# Patient Record
Sex: Female | Born: 1945 | Race: Black or African American | Hispanic: No | Marital: Single | Smoking: Former smoker
Health system: Southern US, Community
[De-identification: ages and names within clinical notes are randomized; demographics above are authoritative.]

## PROBLEM LIST (undated history)

## (undated) DIAGNOSIS — I10 Essential (primary) hypertension: Secondary | ICD-10-CM

## (undated) DIAGNOSIS — E119 Type 2 diabetes mellitus without complications: Secondary | ICD-10-CM

## (undated) DIAGNOSIS — H409 Unspecified glaucoma: Secondary | ICD-10-CM

## (undated) DIAGNOSIS — C9000 Multiple myeloma not having achieved remission: Secondary | ICD-10-CM

## (undated) DIAGNOSIS — K922 Gastrointestinal hemorrhage, unspecified: Secondary | ICD-10-CM

## (undated) DIAGNOSIS — K5792 Diverticulitis of intestine, part unspecified, without perforation or abscess without bleeding: Secondary | ICD-10-CM

## (undated) DIAGNOSIS — K5790 Diverticulosis of intestine, part unspecified, without perforation or abscess without bleeding: Secondary | ICD-10-CM

## (undated) HISTORY — PX: THYROIDECTOMY: SHX17

## (undated) HISTORY — PX: CHOLECYSTECTOMY: SHX55

## (undated) HISTORY — PX: ABDOMINAL HYSTERECTOMY: SHX81

## (undated) HISTORY — PX: COLON SURGERY: SHX602

---

## 2004-04-23 ENCOUNTER — Ambulatory Visit: Payer: Self-pay | Admitting: Family Medicine

## 2008-07-04 ENCOUNTER — Ambulatory Visit: Payer: Self-pay | Admitting: Family Medicine

## 2008-07-04 ENCOUNTER — Emergency Department: Payer: Self-pay | Admitting: Internal Medicine

## 2008-08-04 ENCOUNTER — Ambulatory Visit: Payer: Self-pay | Admitting: Family Medicine

## 2010-05-03 ENCOUNTER — Ambulatory Visit: Payer: Self-pay | Admitting: Family

## 2011-09-12 ENCOUNTER — Inpatient Hospital Stay: Payer: Self-pay | Admitting: Internal Medicine

## 2011-09-12 LAB — COMPREHENSIVE METABOLIC PANEL
Calcium, Total: 8.6 mg/dL (ref 8.5–10.1)
Chloride: 111 mmol/L — ABNORMAL HIGH (ref 98–107)
Creatinine: 1.06 mg/dL (ref 0.60–1.30)
EGFR (African American): >60
Potassium: 4 mmol/L (ref 3.5–5.1)
SGOT(AST): 15 U/L (ref 15–37)
Sodium: 144 mmol/L (ref 136–145)
Total Protein: 6.9 g/dL (ref 6.4–8.2)

## 2011-09-12 LAB — CBC
MCH: 24.9 pg — ABNORMAL LOW (ref 26.0–34.0)
MCHC: 32.1 g/dL (ref 32.0–36.0)
MCV: 78 fL — ABNORMAL LOW (ref 80–100)
Platelet: 232 10*3/uL (ref 150–440)
RBC: 4.54 10*6/uL (ref 3.80–5.20)

## 2011-09-12 LAB — HEMOGLOBIN
HGB: 10.7 g/dL — ABNORMAL LOW (ref 12.0–16.0)
HGB: 10.2 g/dL — ABNORMAL LOW (ref 12.0–16.0)

## 2011-09-13 LAB — CBC WITH DIFFERENTIAL/PLATELET
Basophil #: 0 10*3/uL (ref 0.0–0.1)
Basophil %: 0.5 %
Eosinophil #: 0.1 10*3/uL (ref 0.0–0.7)
Eosinophil %: 1.8 %
HGB: 9.4 g/dL — ABNORMAL LOW (ref 12.0–16.0)
MCH: 25.1 pg — ABNORMAL LOW (ref 26.0–34.0)
MCHC: 32.2 g/dL (ref 32.0–36.0)
MCV: 78 fL — ABNORMAL LOW (ref 80–100)
Monocyte #: 0.4 x10 3/mm (ref 0.2–0.9)
Platelet: 200 10*3/uL (ref 150–440)
WBC: 4.5 10*3/uL (ref 3.6–11.0)

## 2011-09-13 LAB — HEMOGLOBIN: HGB: 8.5 g/dL — ABNORMAL LOW (ref 12.0–16.0)

## 2011-09-14 LAB — CBC WITH DIFFERENTIAL/PLATELET
Basophil #: 0 10*3/uL (ref 0.0–0.1)
Basophil %: 0.5 %
Eosinophil #: 0.1 10*3/uL (ref 0.0–0.7)
Eosinophil %: 1.7 %
Lymphocyte #: 2.3 10*3/uL (ref 1.0–3.6)
Lymphocyte %: 47.7 %
MCH: 25 pg — ABNORMAL LOW (ref 26.0–34.0)
MCHC: 32 g/dL (ref 32.0–36.0)
MCV: 78 fL — ABNORMAL LOW (ref 80–100)
Monocyte #: 0.4 x10 3/mm (ref 0.2–0.9)
Neutrophil %: 40.6 %
Platelet: 185 10*3/uL (ref 150–440)
RBC: 3.2 10*6/uL — ABNORMAL LOW (ref 3.80–5.20)
RDW: 16.3 % — ABNORMAL HIGH (ref 11.5–14.5)
WBC: 4.7 10*3/uL (ref 3.6–11.0)

## 2011-09-15 LAB — CBC WITH DIFFERENTIAL/PLATELET
Eosinophil #: 0.1 10*3/uL (ref 0.0–0.7)
Eosinophil %: 2 %
HCT: 23.3 % — ABNORMAL LOW (ref 35.0–47.0)
Lymphocyte #: 2.6 10*3/uL (ref 1.0–3.6)
MCH: 24.5 pg — ABNORMAL LOW (ref 26.0–34.0)
MCV: 79 fL — ABNORMAL LOW (ref 80–100)
Monocyte #: 0.4 x10 3/mm (ref 0.2–0.9)
Neutrophil #: 1.8 10*3/uL (ref 1.4–6.5)
Neutrophil %: 36.2 %
RDW: 15.9 % — ABNORMAL HIGH (ref 11.5–14.5)

## 2011-09-15 LAB — HEMOGLOBIN: HGB: 9.1 g/dL — ABNORMAL LOW (ref 12.0–16.0)

## 2011-09-16 LAB — HEMOGLOBIN: HGB: 9.7 g/dL — ABNORMAL LOW (ref 12.0–16.0)

## 2011-09-17 LAB — HEMOGLOBIN: HGB: 8.2 g/dL — ABNORMAL LOW (ref 12.0–16.0)

## 2014-05-06 IMAGING — NM NUCLEAR MEDICINE GASTROINTESTINAL BLEEDING STUDY
1 series · 6 of 6 positions shown · non-contrast
Comparison: none

REASON FOR EXAM: rectal bleeding
COMMENTS:   LMP: Post Hysterectomy

[Series 1000: gi bleed · 4.80mm/px · 6 of 250 frames shown]
[frame 21/250]
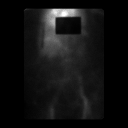
[frame 63/250]
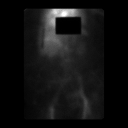
[frame 105/250]
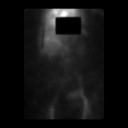
[frame 146/250]
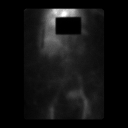
[frame 188/250]
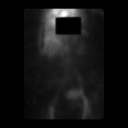
[frame 230/250]
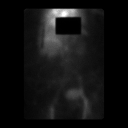

[6 of 6 positions shown; findings below may reference images not displayed]

PROCEDURE:     NM  - NM GI BLOOD LOSS STUDY  - September 12, 2011 [DATE]

RESULT:     Following intravenous administration of 3.0 mm PYP and
mCi technetium 99m Pertechnetate, serial views of the abdomen were obtained
up to 1 hour. No abnormal focal areas of increased tracer activity
indicative of GI bleed are seen. Tracer activity is visualized in the
urinary bladder.
IMPRESSION: No GI bleed is identified.

[REDACTED]

## 2014-05-06 IMAGING — CT CT ABD-PELV W/ CM
1 of 3 series · 12 of 32 positions shown, 18 images · non-contrast
Comparison: none

REASON FOR EXAM: (1) LUQ/LLQ pain, moderate blood in stool, h/o
diverticulitis with partial colec
COMMENTS:

[Series 2: 3mm soft tissue · axial · 0.79mm/px · z∈[-460,-91]mm · 12 of 147 slices shown, 18 images]
[im 12/147  soft-tissue]
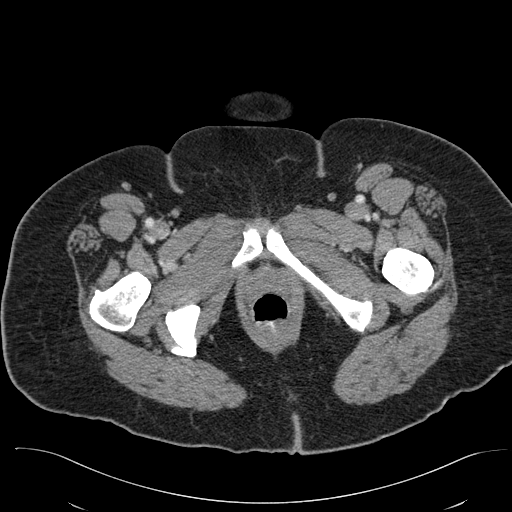
[im 12/147  bone]
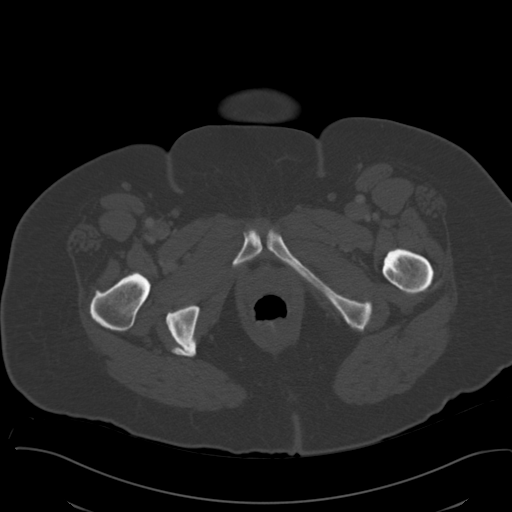
[im 23/147  soft-tissue]
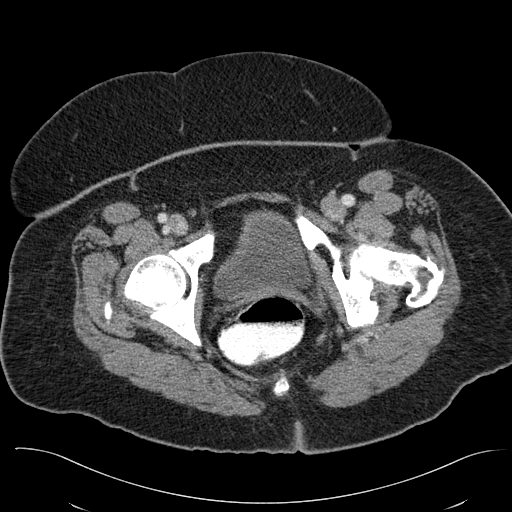
[im 34/147  soft-tissue]
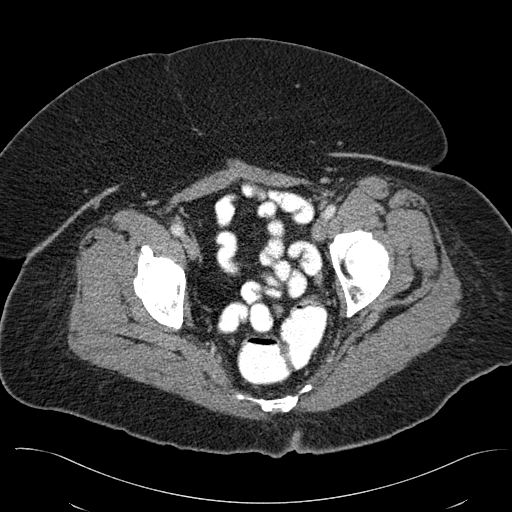
[im 45/147  soft-tissue]
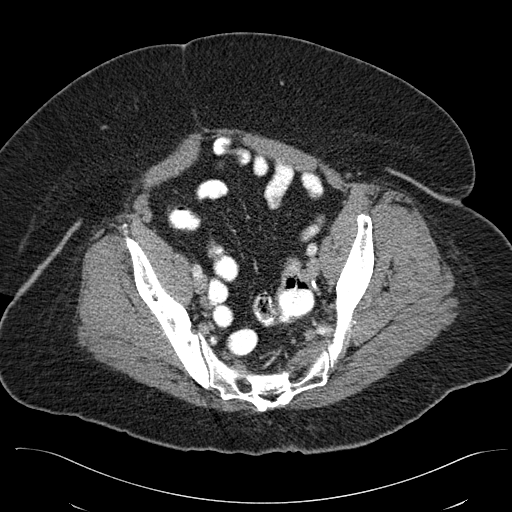
[im 57/147  soft-tissue]
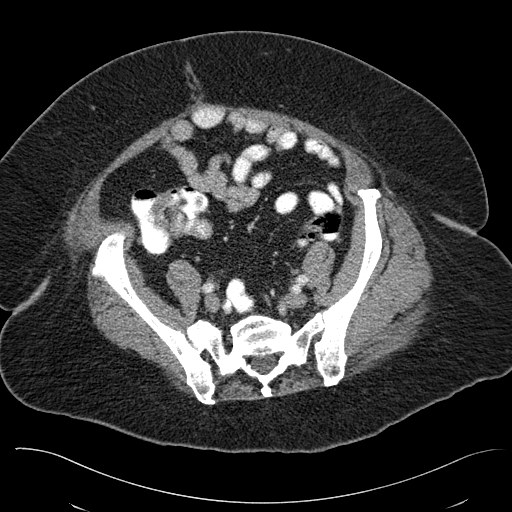
[im 68/147  soft-tissue]
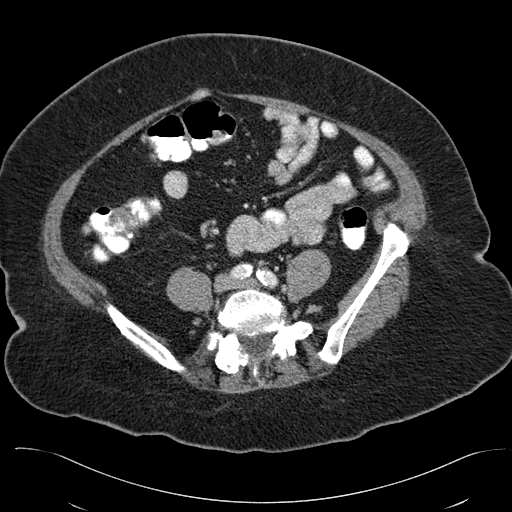
[im 79/147  soft-tissue]
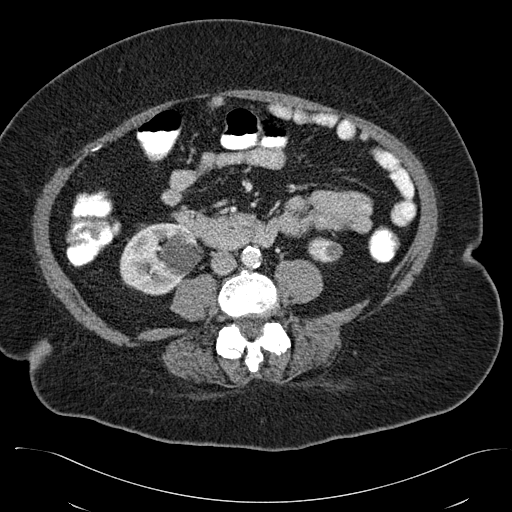
[im 90/147  soft-tissue]
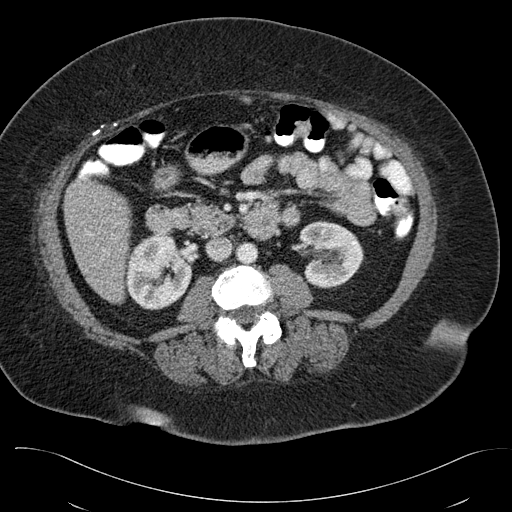
[im 102/147  soft-tissue]
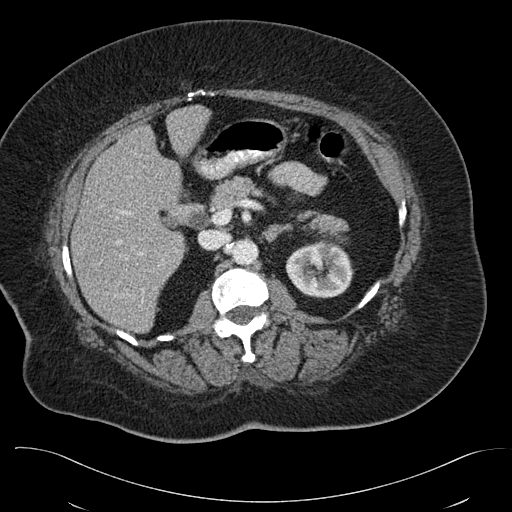
[im 102/147  lung]
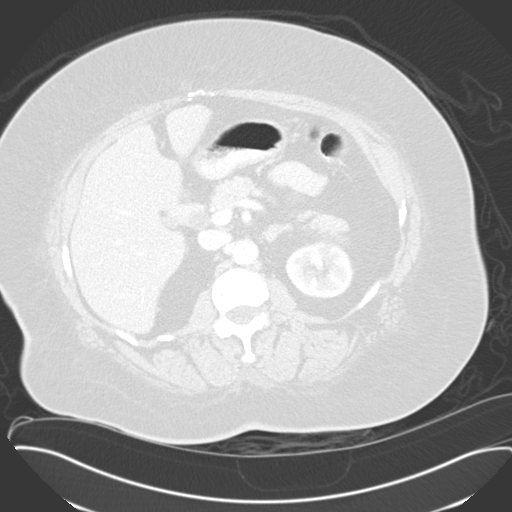
[im 102/147  bone]
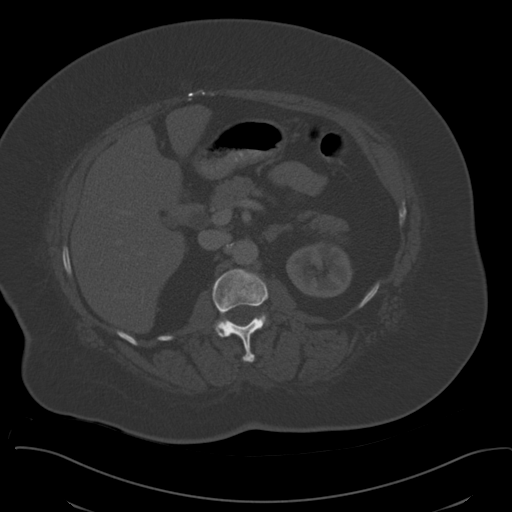
[im 113/147  soft-tissue]
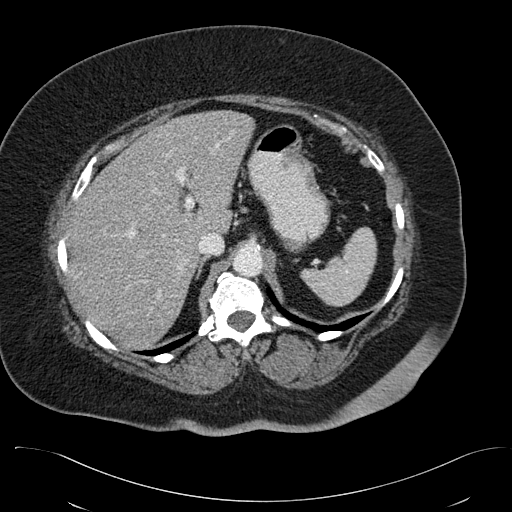
[im 113/147  lung]
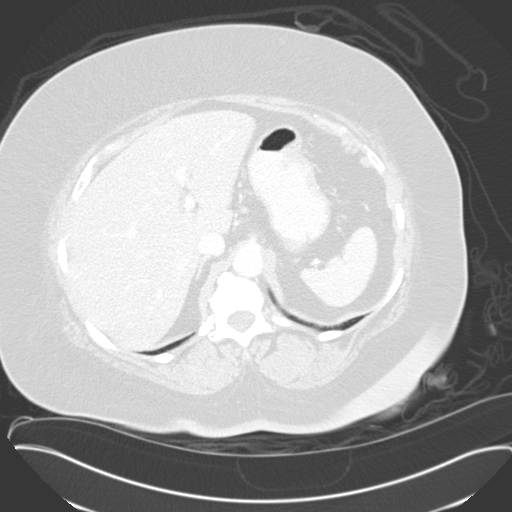
[im 124/147  soft-tissue]
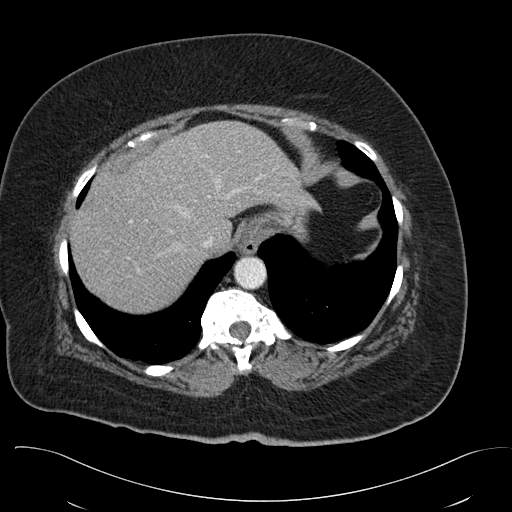
[im 124/147  lung]
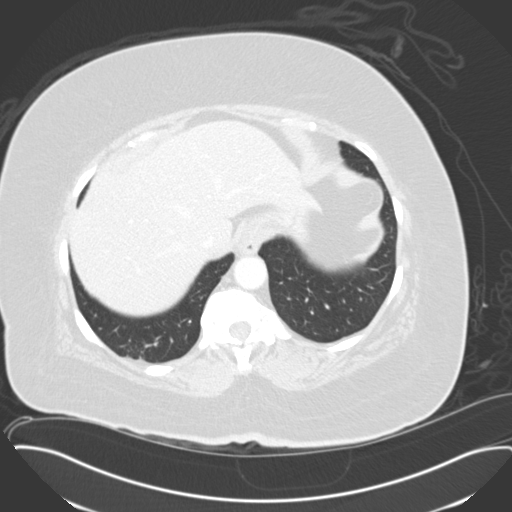
[im 135/147  soft-tissue]
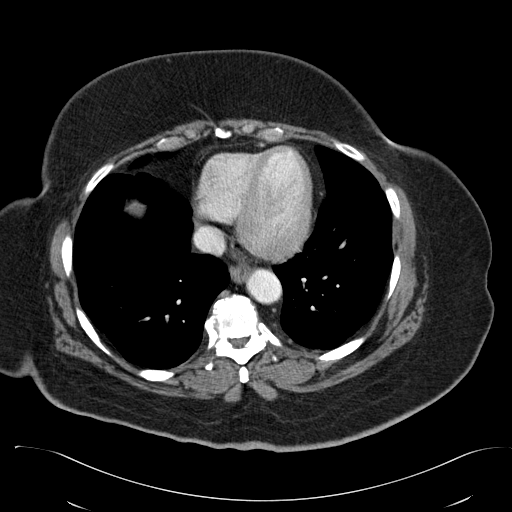
[im 135/147  lung]
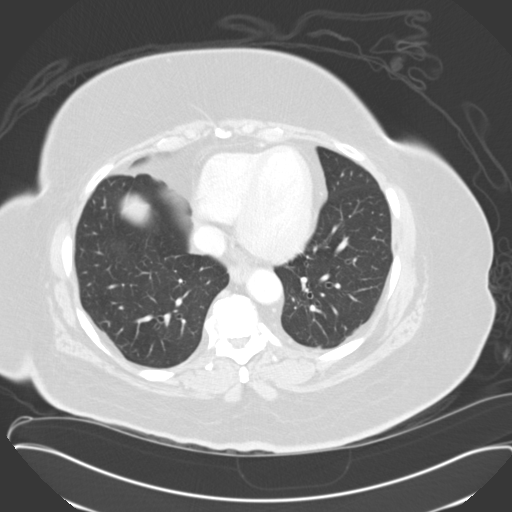

[12 of 32 positions shown; findings below may reference images not displayed]

PROCEDURE:     CT  - CT ABDOMEN / PELVIS  W  - September 12, 2011  [DATE]

RESULT:     Axial CT scanning was performed through the abdomen and pelvis
with reconstructions at 3 mm intervals and slice thicknesses. The patient
received 100 cc of 3sovue-100 and also received oral contrast material.
Review of multiplanar reconstructed images was performed separately on the
VIA monitor.

The orally administered contrast has traversed the small and large bowel.
There is a small umbilical hernia which contains short segment of small
bowel. This does not appear obstructed. There are scattered diverticula but
I do not see evidence of acute diverticulitis.

The liver exhibits decreased density suggesting fatty infiltration. The
gallbladder is surgically absent. The pancreas, partially distended stomach,
spleen, and adrenal glands are normal in appearance.

The kidneys exhibit hypodensities of varying sizes. There is a focus of
decreased density centered in the posterior cortex of the right kidney with
Hounsfield measurement of 52 on the immediate postcontrast images. On
delayed images it has Hounsfield measurement of 37. This measures proximally
1.2 cm in diameter and is not classic for a simple cyst. The kidneys exhibit
no evidence of obstruction nor of pyelonephritis.

The caliber of the abdominal aorta is normal. The urinary bladder is
partially distended and grossly normal. The uterus is surgically absent. I
see no adnexal masses. The lung bases exhibit minimal atelectasis or
fibrosis in the right posterior costophrenic gutter. The lumbar vertebral
bodies are preserved in height.
IMPRESSION: 1. I do not see evidence of acute diverticulitis or other acute bowel
abnormality. A knuckle of small bowel entersa shallow ventral hernia but
does not appear obstructed.
2. The gallbladder is surgically absent. I see no acute hepatobiliary
abnormality.
3. There are multiple hypodensities within the kidneys most compatible with
cysts. One however in the midpole of the right kidney posteriorly has
hyperdense Hounsfield numbers and is not clearly a simple cyst. Given that
there are no previous studies, a triphasic CT scan through the kidneys would
be useful for accurate Hounsfield measurement.

A preliminary report was sent to the [HOSPITAL] the conclusion
of the study.

## 2014-08-20 NOTE — Discharge Summary (Signed)
PATIENT NAME:  Hannah Graves, Hannah Graves MR#:  [id number] DATE OF BIRTH:  July 23, 1945  DATE OF ADMISSION:  09/12/2011 DATE OF DISCHARGE:  09/17/2011  PRIMARY CARE PHYSICIAN: Deirdre Evener, MD  FINAL DIAGNOSES:  1. Acute hemorrhagic anemia.  2. Lower gastrointestinal bleed, suspected diverticular.  3. Hypertension.  4. Diabetes.  5. Glaucoma.   DISCHARGE MEDICATIONS: 1. Metformin 500 mg extended-release daily.  2. Atenolol 100 mg daily.  3. Enalapril 20 mg daily.  4. Nitroglycerin 0.4 mg sublingually as needed.  5. Glipizide 10 mg extended-release daily.  6. Diltiazem 180 mg daily.  7. Latanoprost 0.005% ophthalmic solution to each eye at bedtime.   ADDITIONAL MEDICATION: Ferrous sulfate 325 mg twice a day.   NOTE: Do not take aspirin until seen by Dr. Jill Side as an outpatient.   DIET: Low sodium diet.   ACTIVITY: As tolerated.   DISCHARGE FOLLOWUP: Followup with Dr. Jill Side in two weeks and Dr. Deirdre Evener in one week.   REASON FOR ADMISSION: The patient was admitted 09/12/2011 and discharged 09/17/2011. She came in with rectal bleeding, large amount of rectal blood, five episodes at home and another two episodes in the emergency room. She was admitted with recurrent rectal bleeding, total of seven episodes.   LABS/STUDIES: Glucose 162, BUN 16, creatinine 1.06, sodium 144, potassium 4.0, chloride 111, CO2 25, and calcium 8.6. Liver function tests normal range. White blood cell count 6.1, hemoglobin and hematocrit 11.3 and 35.2, and platelet count 232.   EKG shows normal sinus rhythm, T wave abnormality inferolaterally.  CT scan of the abdomen and pelvis showed no evidence of acute diverticulitis, gallbladder surgically absent, and multiple hypodensities in the kidneys most compatible with cysts. One mid pole of the right kidney has a hyperdense Hounsfield unit that is not clearly a simple cyst. Can do a triphasic CT scan for further determination.   A GI blood loss  study was negative.   Hemoglobin dipped down to 10.7, then 10.2, then 9.4, then 8.5, 8.0, and then 7.3. The patient was transfused a unit of packed red blood cells, went up to 8.7 and then drifted down to 8.2 upon discharge.   HOSPITAL COURSE PER PROBLEM LIST:  1. For the patient's acute hemorrhagic anemia, hemoglobin kept drifting down. The patient did bleed, then stopped, then bled, and then bled again. A colonoscopy was done by Dr. Dionne Milo on 09/16/2011 that showed internal hemorrhoids and diverticulosis in the ascending colon. No active bleeding. The patient had no further bleeding from the point of the colonoscopy prep onwards. Hemoglobin is on the lower side at 8.2. The patient was transfused 1 unit of packed red blood cells on the hemoglobin of 7.3 and given iron supplementation upon discharge. Her aspirin was held.  2. For the lower gastrointestinal bleed, it is suspected to be diverticular bleed. No further bleeding upon discharge. The patient was discharged home in stable condition.  3. For her hypertension, blood pressure was stable throughout the hospital stay, 137/71 upon discharge.  4. For her diabetes, she was restarted on her glipizide and metformin once the diet was started back up again. Blood sugar upon discharge 122. 5. For glaucoma she is on latanoprost.     6. For her abnormal appearance of the kidney on initial CAT scan, can end up getting a CT scan with triphasic contrast as an outpatient. I know the patient does follow-up with a doctor for her multiple myeloma and was supposed to get a MRI of the back. Follow  up as an outpatient needed on this even if a sonogram is ordered.    TIME SPENT ON DISCHARGE: 35 minutes. ____________________________ Tana Conch. Leslye Peer, MD rjw:slb D: 09/17/2011 16:38:13 ET T: 09/18/2011 13:50:29 ET JOB#: [id number]  cc: Tana Conch. Leslye Peer, MD, <Dictator> Beat D. Albertine Patricia, MD Marisue Brooklyn MD ELECTRONICALLY SIGNED 09/28/2011 13:57

## 2014-08-20 NOTE — Consult Note (Signed)
Chief Complaint:   Subjective/Chief Complaint Feels well. No bleeding since yesterday morning.   VITAL SIGNS/ANCILLARY NOTES: **Vital Signs.:   19-May-13 08:10   Vital Signs Type Routine   Temperature Temperature (F) 97   Celsius 36.1   Temperature Source tympanic   Pulse Pulse 87   Pulse source per Dinamap   Respirations Respirations 16   Systolic BP Systolic BP 008   Diastolic BP (mmHg) Diastolic BP (mmHg) 69   Mean BP 85   Pulse Ox % Pulse Ox % 94   Pulse Ox Activity Level  At rest   Oxygen Delivery Room Air/ 21 %   Routine Hem:  19-May-13 06:36    WBC (CBC) 4.7   RBC (CBC) 3.20   Hemoglobin (CBC) 8.0   Hematocrit (CBC) 25.0   Platelet Count (CBC) 185   MCV 78   MCH 25.0   MCHC 32.0   RDW 16.3   Neutrophil % 40.6   Lymphocyte % 47.7   Monocyte % 9.5   Eosinophil % 1.7   Basophil % 0.5   Neutrophil # 1.9   Lymphocyte # 2.3   Monocyte # 0.4   Eosinophil # 0.1   Basophil # 0.0   Assessment/Plan:  Assessment/Plan:   Assessment Suspected diverticular bleeding, resolved. Blood loss anemia.    Plan Agree with full liquid diet. Repeat H and H in am. Will transfuse if further worsening in H and H or signs of active bleeding. If no further bleeding and H and H is stable by tomorrow, may go home with OP follow up with me in 2 weeks.   Electronic Signatures: Jill Side (MD)  (Signed [phone] 10:06)  Authored: Chief Complaint, VITAL SIGNS/ANCILLARY NOTES, Lab Results, Assessment/Plan   Last Updated: 19-May-13 10:06 by Jill Side (MD)

## 2014-08-20 NOTE — Consult Note (Signed)
Chief Complaint:   Subjective/Chief Complaint Patient had another episode of bleeding with further drop in H and H. Hemoglobin is better after transfusion. Will proceed with a colonoscopy tomorrow. Further recommendations to follow.   Electronic Signatures: Jill Side (MD)  (Signed 20-May-13 17:59)  Authored: Chief Complaint   Last Updated: 20-May-13 17:59 by Jill Side (MD)

## 2014-08-20 NOTE — Consult Note (Signed)
Chief Complaint:   Subjective/Chief Complaint Colonoscopy showed only few small right sided diverticuli wihout stigmata of recent bleeding. Colon is otherwise normal except medium internal hemorrhoids. No signs of bleeding more proximally as stools in colon were light green.  Imopression: Most likely diverticular bleeding, resolved. Colonoscopy withou any high risk lesions.  Recommendations: Full liquid diet. Possible DC in am if no further bleeding.   Electronic Signatures: Jill Side (MD)  (Signed 21-May-13 18:08)  Authored: Chief Complaint   Last Updated: 21-May-13 18:08 by Jill Side (MD)

## 2014-08-20 NOTE — Consult Note (Signed)
Chief Complaint:   Subjective/Chief Complaint Had further bleeding this morning, painless. Hemoglobin dropped to 8.5. Feels well overall.   VITAL SIGNS/ANCILLARY NOTES: **Vital Signs.:   18-May-13 16:10   Vital Signs Type Q 4hr   Temperature Temperature (F) 98   Celsius 36.6   Temperature Source oral   Pulse Pulse 75   Pulse source per Dinamap   Respirations Respirations 16   Systolic BP Systolic BP 354   Diastolic BP (mmHg) Diastolic BP (mmHg) 80   Mean BP 94   BP Source Dinamap   Pulse Ox % Pulse Ox % 97   Pulse Ox Activity Level  At rest   Oxygen Delivery Room Air/ 21 %   Routine Hem:  18-May-13 06:13    WBC (CBC) 4.5   RBC (CBC) 3.74   Hemoglobin (CBC) 9.4   Hematocrit (CBC) 29.2   Platelet Count (CBC) 200   MCV 78   MCH 25.1   MCHC 32.2   RDW 16.4   Neutrophil % 35.3   Lymphocyte % 53.5   Monocyte % 8.9   Eosinophil % 1.8   Basophil % 0.5   Neutrophil # 1.6   Lymphocyte # 2.4   Monocyte # 0.4   Eosinophil # 0.1   Basophil # 0.0  Blood Glucose:  18-May-13 07:36    POCT Blood Glucose 128    11:46    POCT Blood Glucose 171    13:38    POCT Blood Glucose 93  Routine Hem:  18-May-13 15:23    Hemoglobin (CBC) 8.5  Blood Glucose:  18-May-13 16:12    POCT Blood Glucose 109   Assessment/Plan:  Assessment/Plan:   Assessment Lower GI bleed. Diverticular bleed remains the most likely diagnosis. Hemodynamically stable.    Plan Follow H and H and transfuse if hemoglobin drops further. Clear liquid diet for now. Will follow.   Electronic Signatures: Jill Side (MD)  (Signed [phone] 17:25)  Authored: Chief Complaint, VITAL SIGNS/ANCILLARY NOTES, Lab Results, Assessment/Plan   Last Updated: 18-May-13 17:25 by Jill Side (MD)

## 2014-08-20 NOTE — H&P (Signed)
PATIENT NAME:  Hannah Graves, Hannah Graves MR#:  [id number] DATE OF BIRTH:  07/07/1945  DATE OF ADMISSION:  09/12/2011  PRIMARY CARE PHYSICIAN: Dr. Deirdre Evener.   CHIEF COMPLAINT: Rectal bleeding.   HISTORY OF PRESENT ILLNESS: Hannah Graves is a 69 year old African American female who was in her usual state of health until the last 24 hours when she started to have bright red blood per rectum. The patient reports a large amount of red blood, about five episodes at home. She had another two episodes while she is here in the Emergency Department. There is associated occasional mild cramping in the abdomen and a little nausea, but no vomiting and no fever. There is no prior history of rectal bleeding. She gave history of prior colon resection, stating it was for infection and a pocket of pus.   REVIEW OF SYSTEMS: CONSTITUTIONAL: Denies any fever. No chills. No fatigue. EYES: No blurring of vision. No double vision. ENT: No hearing impairment. No sore throat. No dysphagia. CARDIOVASCULAR: No chest pain. No shortness of breath. No edema. No syncope. RESPIRATORY: No cough. No sputum production. No shortness of breath. No chest pain. GASTROINTESTINAL: She has a little crampy abdominal pain occasionally in the last few hours and bright red blood per rectum. She had several episodes. No vomiting. GENITOURINARY: No dysuria. No frequency of urination. MUSCULOSKELETAL: No joint pain or swelling. No muscular pain or swelling. INTEGUMENTARY: No skin rash. No ulcers. NEUROLOGIC: No focal weakness. No seizure activity. No headache. PSYCHIATRY: No anxiety. No depression. ENDOCRINE: No polyuria or polydipsia. No heat or cold intolerance.   PAST MEDICAL HISTORY:  1. History of diabetes mellitus, type II. 2. Systemic hypertension. 3. Glaucoma.  4. History of multiple myeloma, in remission since 2004 after having stem cell transplant.   PAST SURGICAL HISTORY:  1. Colon resection for infection. 2. Partial thyroidectomy for  goiter. 3. Cholecystectomy. 4. Hysterectomy.   SOCIAL HABITS: Nonsmoker. No history of alcohol abuse.   SOCIAL HISTORY: She is divorced and has two girls. She is retired working with [employer].   FAMILY HISTORY: Both parents are deceased. They suffered from hypertension, diabetes, and both had heart attacks. She had a brother who died from pancreatic cancer.   ADMISSION MEDICATIONS:  1. Nitroglycerin 0.4 mg sublingual p.r.n.  2. Metformin 500 mg a day. 3. Glipizide 10 mg a day. 4. Enalapril 20 mg once a day. 5. Diltiazem 180 mg once a day. 6. Atenolol 100 mg once a day.  7. Aspirin 81 mg a day. 8. Eye drops using latanoprost 0.005% one drop in each eye once a day.   ALLERGIES: Sulfa causes skin rash.   PHYSICAL EXAMINATION:  VITAL SIGNS: Blood pressure 140/64, respiratory rate 18, pulse 60, temperature 98.4, oxygen saturation 100%. She is on oxygen.   GENERAL APPEARANCE: Elderly lady lying in bed in no acute distress.   HEAD AND NECK EXAMINATION: No pallor. No icterus. No cyanosis.   ENT: Hearing was normal. Nasal mucosa, lips, tongue were normal. She has upper dentures. Eye examination revealed normal eyelids and conjunctivae. Pupils about 5 mm, equal and reactive to light.   NECK: Supple. Trachea at midline. No cervical masses or lymphadenopathy.   HEART: Normal S1, S2. No S3, S4. No murmur. No gallop. No carotid bruits.   RESPIRATORY: Normal breathing pattern without use of accessory muscles. No rales. No wheezing.   ABDOMEN: Soft, distended without tenderness. No hepatosplenomegaly. No masses. No hernias.   SKIN: No ulcers. No subcutaneous nodules.   MUSCULOSKELETAL:  No joint swelling. No clubbing. She has a dressing over the left foot from recent surgery for bunion.   NEUROLOGIC: Cranial nerves II through XII are intact. No focal motor deficit.   PSYCHIATRIC: The patient is alert and oriented x3. Mood and affect were flat.   LABORATORY, DIAGNOSTIC, AND  RADIOLOGICAL DATA: Serum glucose 162, BUN 16, creatinine 1.06, sodium 144, potassium 4. Her liver function tests were normal, except for low albumin at 3.3. CBC showed white count 6,000, hemoglobin 11.3, hematocrit 35, platelet count 232. MCV was low at 78, MCH 24, MCHC 32. CT scan of the abdomen revealed previous cholecystectomy and hysterectomy. Very small umbilical hernia with very small portion of  small bowel slightly bulging into the umbilical hernia. No evidence of bowel obstruction. Colonic diverticulosis without evidence of diverticulitis. There is a small posterior lateral right renal intermediate density lesion, which is not definitely a simple cyst.   ASSESSMENT:  1. Recurrent rectal bleeding. She had a total of seven episodes of bright red blood per rectum in the last 24 hours.  2. Incidental finding of right kidney lesion, etiology is unclear.  3. Microcytic hypochromic anemia  4. Systemic hypertension.  5. Diabetes mellitus type 2.  6. History of multiple myeloma in remission since 2004.  7. Glaucoma. 8. History of colon resection.  9. History of partial thyroidectomy for goiter.  10. History of cholecystectomy.  11. Hysterectomy.   PLAN:  1. The patient was admitted to the telemetry for further evaluation.  2. Follow-up on hemoglobin q.8 hours.  3. I ordered nuclear bleeding scan.  4. Gastroenterology consultation.  5. I will continue her home medications as listed above, but I will hold aspirin.  6. I will hold metformin since she had a CT scan, although it is not clear for me if she received IV contrast or not.  7. I will place the patient on Protonix 40 mg p.o. daily hold although her bleeding appears to be lower gastrointestinal in origin. However, occasionally it could be upper gastrointestinal with rapid transit.  8. I spoke to the patient regarding LIVING WILL. She is unsure if she has one, but she indicates that she gave the power of attorney to her oldest daughter.    TIME SPENT EVALUATING THIS PATIENT: More than 50 minutes.   ____________________________ Clovis Pu. Lenore Manner, MD amd:ap D: 09/12/2011 06:30:43 ET T: 09/12/2011 07:14:26 ET JOB#: [id number]  cc: Clovis Pu. Lenore Manner, MD, <Dictator> Beat D. Albertine Patricia, MD Clovis Pu [place] MD ELECTRONICALLY SIGNED 09/12/2011 22:33

## 2014-08-20 NOTE — Consult Note (Signed)
PATIENT NAME:  Hannah Graves, Hannah Graves MR#:  [id number] DATE OF BIRTH:  Aug 25, 1945  DATE OF CONSULTATION:  09/12/2011  REFERRING PHYSICIAN:  Wilfred Curtis, MD   CONSULTING PHYSICIAN:  Jill Side, MD  REASON FOR CONSULTATION: Hematochezia.   HISTORY OF PRESENT ILLNESS: The patient is a 69 year old female who presented to the Emergency Room earlier this morning with several episodes of bright red blood per rectum at home starting at 4:00 p.m. yesterday afternoon. According to the patient, the first episode was at around 4:00 p.m. yesterday afternoon. After that, she had at least 4 or 5 episodes of dark red blood per rectum with clots. She had no significant abdominal pain. She came to the Emergency Room and had two more episodes in the ER last night. This morning she had one episode of bright red blood per rectum which was lighter in color, according to her. Her last bowel movement was a couple of hours ago, and she saw no blood in there. The patient denies any such symptoms in the past. She denies any nausea, vomiting, abdominal pain, or any other significant GI symptoms at this point.   PAST MEDICAL HISTORY: Significant for: 1. History of type 2 diabetes.  2. Systemic hypertension.  3. Glaucoma.  4. History of multiple myeloma, in remission.   PAST SURGICAL HISTORY:  1. History of colon resection for what appears to be acute diverticulitis.  2. History of thyroidectomy.  3. Cholecystectomy.  4. Hysterectomy.   ALLERGIES: Sulfa.    HOME MEDICATIONS: Metformin, glipizide, enalapril, diltiazem, atenolol,  aspirin, eyedrops, and nitroglycerin.   SOCIAL HISTORY: She is divorced. She does not smoke or drink.   FAMILY HISTORY: Unremarkable.   REVIEW OF SYSTEMS: Review of systems is grossly negative except for what is mentioned in the history of present illness.   PHYSICAL EXAMINATION:  GENERAL: Obese female, does not appear to be in any acute distress. Fully awake, alert, and oriented.    VITAL SIGNS: Heart rate is in the 60s and 70s. Blood pressure is 130/78.   HEENT: Examination is unremarkable.   NECK: Veins are flat.   LUNGS: Grossly clear to auscultation.   CARDIOVASCULAR: Regular rate and rhythm. No gallops or murmur.   ABDOMEN: Slightly distended but soft. Bowel sounds are positive. Nontender, no rebound or guarding was noted. No hepatosplenomegaly.   NEUROLOGICAL: Examination appears to be unremarkable.   LABORATORY, DIAGNOSTIC AND RADIOLOGICAL DATA:  Hemoglobin on admission was 11.3. With hydration it has slowly dropped down to 10.2. White cell count is 6.1.  Electrolytes are fine as well as BUN and creatinine. CT scan of the abdomen and pelvis showed multiple hyperdensities within the kidneys, most compatible cysts. No acute changes were noted. The gallbladder is surgically absent.   ASSESSMENT AND PLAN: Patient with hematochezia most likely secondary to diverticulosis. The patient had surgery several years ago for what appears to be a diverticular abscess which makes me think that the patient does have diverticulosis. The patient had a colonoscopy in 2009 in [place]. That report is not available. The patient's last bowel movement was without blood, and it appears that the bleeding has subsided. Her hemoglobin and hematocrit remain stable after a minimal drop due to hydration. I would advance her diet from clear liquid to full liquid. We will continue to observe her for another 24 to 48 hours. Further recommendations will be made in case we see signs of active recurrent gastrointestinal bleed. If there is no further bleeding within the next 24  to 48 hours, then no urgent intervention will be required, and the patient can follow up with a gastroenterologist as outpatient for further recommendations. A nuclear bleeding scan was negative as well. The plan has been discussed with the patient as well as Dr. Verdell Carmine. We will advance from clear liquid to full liquid,  repeat hemoglobin in the morning. Further recommendations to follow depending on the patient's hospital course.   ____________________________ Jill Side, MD si:cbb D: 09/12/2011 16:02:53 ET T: 09/12/2011 17:45:16 ET JOB#: [id number]  cc: Jill Side, MD, <Dictator> Jill Side MD ELECTRONICALLY SIGNED 09/17/2011 9:04

## 2015-03-20 ENCOUNTER — Encounter: Payer: Self-pay | Admitting: Emergency Medicine

## 2015-03-20 ENCOUNTER — Inpatient Hospital Stay: Payer: Medicare HMO

## 2015-03-20 ENCOUNTER — Inpatient Hospital Stay
Admission: EM | Admit: 2015-03-20 | Discharge: 2015-03-22 | DRG: 378 | Disposition: A | Discharge status: Home / Self Care | Payer: Medicare HMO | Attending: Internal Medicine | Admitting: Internal Medicine

## 2015-03-20 DIAGNOSIS — K625 Hemorrhage of anus and rectum: Secondary | ICD-10-CM | POA: Diagnosis present

## 2015-03-20 DIAGNOSIS — Z794 Long term (current) use of insulin: Secondary | ICD-10-CM

## 2015-03-20 DIAGNOSIS — I10 Essential (primary) hypertension: Secondary | ICD-10-CM | POA: Diagnosis present

## 2015-03-20 DIAGNOSIS — Z87891 Personal history of nicotine dependence: Secondary | ICD-10-CM | POA: Diagnosis not present

## 2015-03-20 DIAGNOSIS — Z7982 Long term (current) use of aspirin: Secondary | ICD-10-CM

## 2015-03-20 DIAGNOSIS — E860 Dehydration: Secondary | ICD-10-CM | POA: Diagnosis present

## 2015-03-20 DIAGNOSIS — H409 Unspecified glaucoma: Secondary | ICD-10-CM | POA: Diagnosis present

## 2015-03-20 DIAGNOSIS — K5791 Diverticulosis of intestine, part unspecified, without perforation or abscess with bleeding: Principal | ICD-10-CM | POA: Diagnosis present

## 2015-03-20 DIAGNOSIS — D62 Acute posthemorrhagic anemia: Secondary | ICD-10-CM | POA: Diagnosis present

## 2015-03-20 DIAGNOSIS — C9000 Multiple myeloma not having achieved remission: Secondary | ICD-10-CM | POA: Diagnosis present

## 2015-03-20 DIAGNOSIS — E119 Type 2 diabetes mellitus without complications: Secondary | ICD-10-CM | POA: Diagnosis present

## 2015-03-20 DIAGNOSIS — Z885 Allergy status to narcotic agent status: Secondary | ICD-10-CM

## 2015-03-20 DIAGNOSIS — E875 Hyperkalemia: Secondary | ICD-10-CM | POA: Diagnosis present

## 2015-03-20 DIAGNOSIS — I959 Hypotension, unspecified: Secondary | ICD-10-CM | POA: Diagnosis present

## 2015-03-20 DIAGNOSIS — Z79899 Other long term (current) drug therapy: Secondary | ICD-10-CM | POA: Diagnosis not present

## 2015-03-20 DIAGNOSIS — Z882 Allergy status to sulfonamides status: Secondary | ICD-10-CM | POA: Diagnosis not present

## 2015-03-20 DIAGNOSIS — K922 Gastrointestinal hemorrhage, unspecified: Secondary | ICD-10-CM | POA: Diagnosis present

## 2015-03-20 HISTORY — DX: Diverticulosis of intestine, part unspecified, without perforation or abscess without bleeding: K57.90

## 2015-03-20 HISTORY — DX: Gastrointestinal hemorrhage, unspecified: K92.2

## 2015-03-20 HISTORY — DX: Type 2 diabetes mellitus without complications: E11.9

## 2015-03-20 HISTORY — DX: Multiple myeloma not having achieved remission: C90.00

## 2015-03-20 HISTORY — DX: Diverticulitis of intestine, part unspecified, without perforation or abscess without bleeding: K57.92

## 2015-03-20 HISTORY — DX: Unspecified glaucoma: H40.9

## 2015-03-20 HISTORY — DX: Essential (primary) hypertension: I10

## 2015-03-20 LAB — URINALYSIS COMPLETE WITH MICROSCOPIC (ARMC ONLY)
BILIRUBIN URINE: NEGATIVE
Bacteria, UA: NONE SEEN
GLUCOSE, UA: NEGATIVE mg/dL
HGB URINE DIPSTICK: NEGATIVE
KETONES UR: NEGATIVE mg/dL
LEUKOCYTES UA: NEGATIVE
NITRITE: NEGATIVE
Protein, ur: NEGATIVE mg/dL
SPECIFIC GRAVITY, URINE: 1.014 (ref 1.005–1.030)
pH: 5 (ref 5.0–8.0)

## 2015-03-20 LAB — TYPE AND SCREEN
ABO/RH(D): O POS
Antibody Screen: NEGATIVE

## 2015-03-20 LAB — COMPREHENSIVE METABOLIC PANEL
ALBUMIN: 3.7 g/dL (ref 3.5–5.0)
ALK PHOS: 50 U/L (ref 38–126)
ALT: 21 U/L (ref 14–54)
ANION GAP: 6 (ref 5–15)
AST: 18 U/L (ref 15–41)
BILIRUBIN TOTAL: 0.5 mg/dL (ref 0.3–1.2)
BUN: 21 mg/dL — ABNORMAL HIGH (ref 6–20)
CALCIUM: 8.7 mg/dL — AB (ref 8.9–10.3)
CO2: 26 mmol/L (ref 22–32)
CREATININE: 1.24 mg/dL — AB (ref 0.44–1.00)
Chloride: 112 mmol/L — ABNORMAL HIGH (ref 101–111)
GFR calc non Af Amer: 44 mL/min — ABNORMAL LOW (ref >60)
GFR, EST AFRICAN AMERICAN: 51 mL/min — AB (ref >60)
GLUCOSE: 162 mg/dL — AB (ref 65–99)
Potassium: 5.2 mmol/L — ABNORMAL HIGH (ref 3.5–5.1)
Sodium: 144 mmol/L (ref 135–145)
TOTAL PROTEIN: 7.2 g/dL (ref 6.5–8.1)

## 2015-03-20 LAB — HEMOGLOBIN
Hemoglobin: 10.1 g/dL — ABNORMAL LOW (ref 12.0–16.0)
Hemoglobin: 9.9 g/dL — ABNORMAL LOW (ref 12.0–16.0)

## 2015-03-20 LAB — CBC
HEMATOCRIT: 36.5 % (ref 35.0–47.0)
Hemoglobin: 11.5 g/dL — ABNORMAL LOW (ref 12.0–16.0)
MCH: 24.4 pg — ABNORMAL LOW (ref 26.0–34.0)
MCHC: 31.5 g/dL — ABNORMAL LOW (ref 32.0–36.0)
MCV: 77.5 fL — AB (ref 80.0–100.0)
Platelets: 247 10*3/uL (ref 150–440)
RBC: 4.71 MIL/uL (ref 3.80–5.20)
RDW: 15.7 % — ABNORMAL HIGH (ref 11.5–14.5)
WBC: 4.8 10*3/uL (ref 3.6–11.0)

## 2015-03-20 LAB — GLUCOSE, CAPILLARY
GLUCOSE-CAPILLARY: 95 mg/dL (ref 65–99)
Glucose-Capillary: 84 mg/dL (ref 65–99)

## 2015-03-20 LAB — PROTIME-INR
INR: 1.04
Prothrombin Time: 13.8 seconds (ref 11.4–15.0)

## 2015-03-20 LAB — TROPONIN I

## 2015-03-20 LAB — ABO/RH: ABO/RH(D): O POS

## 2015-03-20 MED ORDER — ONDANSETRON HCL 4 MG/2ML IJ SOLN: 4.0000 mg | Freq: Four times a day (QID) | INTRAMUSCULAR | Status: DC | PRN

## 2015-03-20 MED ORDER — ALBUTEROL SULFATE (2.5 MG/3ML) 0.083% IN NEBU: 2.5000 mg | INHALATION_SOLUTION | RESPIRATORY_TRACT | Status: DC | PRN

## 2015-03-20 MED ORDER — TECHNETIUM TC 99M-LABELED RED BLOOD CELLS IV KIT
20.0000 | PACK | Freq: Once | INTRAVENOUS | Status: AC | PRN
  Administered 2015-03-20: 20.7 via INTRAVENOUS

## 2015-03-20 MED ORDER — INSULIN ASPART 100 UNIT/ML ~~LOC~~ SOLN: 0.0000 [IU] | Freq: Every day | SUBCUTANEOUS | Status: DC

## 2015-03-20 MED ORDER — SODIUM CHLORIDE 0.9 % IV SOLN
1000.0000 mL | Freq: Once | INTRAVENOUS | Status: AC
  Administered 2015-03-20: 1000 mL via INTRAVENOUS

## 2015-03-20 MED ORDER — SODIUM CHLORIDE 0.9 % IV SOLN
INTRAVENOUS | Status: AC
  Administered 2015-03-20 – 2015-03-21 (×2): via INTRAVENOUS

## 2015-03-20 MED ORDER — PANTOPRAZOLE SODIUM 40 MG IV SOLR
40.0000 mg | Freq: Two times a day (BID) | INTRAVENOUS | Status: DC
  Administered 2015-03-20 – 2015-03-22 (×5): 40 mg via INTRAVENOUS
  Filled 2015-03-20 (×5): qty 40

## 2015-03-20 MED ORDER — ONDANSETRON HCL 4 MG PO TABS: 4.0000 mg | ORAL_TABLET | Freq: Four times a day (QID) | ORAL | Status: DC | PRN

## 2015-03-20 MED ORDER — ACETAMINOPHEN 650 MG RE SUPP
650.0000 mg | Freq: Four times a day (QID) | RECTAL | Status: DC | PRN
Start: 2015-03-20 — End: 2015-03-22

## 2015-03-20 MED ORDER — FLUTICASONE PROPIONATE 50 MCG/ACT NA SUSP
2.0000 | Freq: Every day | NASAL | Status: DC
  Administered 2015-03-20 – 2015-03-22 (×3): 2 via NASAL
  Filled 2015-03-20: qty 16

## 2015-03-20 MED ORDER — ACETAMINOPHEN 325 MG PO TABS
650.0000 mg | ORAL_TABLET | Freq: Four times a day (QID) | ORAL | Status: DC | PRN
  Administered 2015-03-21: 650 mg via ORAL
  Filled 2015-03-20: qty 2

## 2015-03-20 MED ORDER — ONDANSETRON HCL 4 MG/2ML IJ SOLN
4.0000 mg | Freq: Once | INTRAMUSCULAR | Status: AC
  Administered 2015-03-20: 4 mg via INTRAVENOUS
  Filled 2015-03-20: qty 2

## 2015-03-20 MED ORDER — INSULIN ASPART 100 UNIT/ML ~~LOC~~ SOLN
0.0000 [IU] | Freq: Three times a day (TID) | SUBCUTANEOUS | Status: DC
Start: 2015-03-20 — End: 2015-03-22
  Administered 2015-03-21: 3 [IU] via SUBCUTANEOUS
  Administered 2015-03-22: 2 [IU] via SUBCUTANEOUS
  Filled 2015-03-20: qty 2
  Filled 2015-03-20: qty 3

## 2015-03-20 NOTE — ED Notes (Signed)
C/o dark red blood in the toilet after having BM this am, states she has hx of rectal bleeding and diverticulitis, states she has had 4 BMs this am already, denies any abd. Pain at present

## 2015-03-20 NOTE — ED Notes (Signed)
Pt more alert, b/p increasing, remians on monitor

## 2015-03-20 NOTE — ED Provider Notes (Addendum)
[facility] Emergency Department Provider Note     Time seen: ----------------------------------------- 10:04 AM on 03/20/2015 -----------------------------------------    I have reviewed the triage vital signs and the nursing notes.   HISTORY  Chief Complaint Rectal Bleeding    HPI Hannah Graves is a 69 y.o. female who presents ER after passing dark red blood in the toilet while having a bowel movement this morning. Patient states she does have a history of rectal bleeding and diverticulitis. States she's had 4 bowel movements this morning already, denies any abdominal pain currently. Patient states the first dose she had was very large, she feels like she lost much more than a cup of blood. Currently feels like she needs to have a bowel movement again.   Past Medical History  Diagnosis Date  . Diverticulitis   . Multiple myeloma (HCC)     There are no active problems to display for this patient.   Past Surgical History  Procedure Laterality Date  . Colon surgery      Allergies Oxycodone and Sulfa antibiotics  Social History Social History  Substance Use Topics  . Smoking status: Former Research scientist (life sciences)  . Smokeless tobacco: None  . Alcohol Use: No    Review of Systems Constitutional: Negative for fever. Eyes: Negative for visual changes. ENT: Negative for sore throat. Cardiovascular: Negative for chest pain. Respiratory: Negative for shortness of breath. Gastrointestinal: Negative for abdominal pain, positive for rectal bleeding Genitourinary: Negative for dysuria. Musculoskeletal: Negative for back pain. Skin: Negative for rash. Neurological: Negative for headaches, positive for weakness  10-point ROS otherwise negative.  ____________________________________________   PHYSICAL EXAM:  VITAL SIGNS: ED Triage Vitals  Enc Vitals Group     BP 03/20/15 0938 110/65 mmHg     Pulse Rate 03/20/15 0938 69     Resp 03/20/15 0938 18      Temp 03/20/15 0938 98.1 F (36.7 C)     Temp Source 03/20/15 0938 Oral     SpO2 03/20/15 0938 98 %     Weight 03/20/15 0938 232 lb (105.235 kg)     Height 03/20/15 0938 5' 6"  (1.676 m)     Head Cir --      Peak Flow --      Pain Score 03/20/15 0939 0     Pain Loc --      Pain Edu? --      Excl. in [place]? --     Constitutional: Alert and oriented. Well appearing and in no distress. Eyes: Conjunctivae are normal. PERRL. Normal extraocular movements. ENT   Head: Normocephalic and atraumatic.   Nose: No congestion/rhinnorhea.   Mouth/Throat: Mucous membranes are moist.   Neck: No stridor. Cardiovascular: Normal rate, regular rhythm. Normal and symmetric distal pulses are present in all extremities. No murmurs, rubs, or gallops. Respiratory: Normal respiratory effort without tachypnea nor retractions. Breath sounds are clear and equal bilaterally. No wheezes/rales/rhonchi. Gastrointestinal: Soft and nontender. No distention. No abdominal bruits.  Rectal: Grossly bloody, dark stool present Musculoskeletal: Nontender with normal range of motion in all extremities. No joint effusions.  No lower extremity tenderness nor edema. Neurologic:  Normal speech and language. No gross focal neurologic deficits are appreciated. Speech is normal. No gait instability. Skin:  Skin is warm, dry and intact. No rash noted. Psychiatric: Mood and affect are normal. Speech and behavior are normal. Patient exhibits appropriate insight and judgment. ____________________________________________  ED COURSE:  Pertinent labs & imaging results that were available during my care  of the patient were reviewed by me and considered in my medical decision making (see chart for details). Patient with bloody stools, possible diverticulosis. We will check basic labs and she will likely need admission.  EKG: Interpreted by me, normal sinus rhythm with a rate of 59 bpm, normal PR interval, normal QS with, normal QT  interval. There is evidence of LVH ____________________________________________    LABS (pertinent positives/negatives)  Labs Reviewed  COMPREHENSIVE METABOLIC PANEL - Abnormal; Notable for the following:    Potassium 5.2 (*)    Chloride 112 (*)    Glucose, Bld 162 (*)    BUN 21 (*)    Creatinine, Ser 1.24 (*)    Calcium 8.7 (*)    GFR calc non Af Amer 44 (*)    GFR calc Af Amer 51 (*)    All other components within normal limits  CBC - Abnormal; Notable for the following:    Hemoglobin 11.5 (*)    MCV 77.5 (*)    MCH 24.4 (*)    MCHC 31.5 (*)    RDW 15.7 (*)    All other components within normal limits  TROPONIN I  URINALYSIS COMPLETEWITH MICROSCOPIC ([facility] ONLY)  PROTIME-INR  TYPE AND SCREEN  ABO/RH   CRITICAL CARE Performed by: Earleen Newport   Total critical care time: 30 minutes  Critical care time was exclusive of separately billable procedures and treating other patients.  Critical care was necessary to treat or prevent imminent or life-threatening deterioration.  Critical care was time spent personally by me on the following activities: development of treatment plan with patient and/or surrogate as well as nursing, discussions with consultants, evaluation of patient's response to treatment, examination of patient, obtaining history from patient or surrogate, ordering and performing treatments and interventions, ordering and review of laboratory studies, ordering and review of radiographic studies, pulse oximetry and re-evaluation of patient's condition.  ____________________________________________  FINAL ASSESSMENT AND PLAN  Rectal bleeding, vasovagal syncope  Plan: Patient with labs and imaging as dictated above. Patient with transient hypotension likely secondary to vasovagal event. Patient had gotten acutely nauseated and dropped her blood pressure and heart rate. As resolved with lying or supine and getting a fluid bolus. Currently labs look grossly  unremarkable, she'll need serial lab work and GI consultation. She is still having bright red blood per rectum currently. Bleeding is likely secondary to diverticulosis.   Earleen Newport, MD   Earleen Newport, MD 03/20/15 [place], MD 03/20/15 1140

## 2015-03-20 NOTE — Consult Note (Signed)
GI Inpatient Consult Note  Reason for Consult: GI bleed   Attending Requesting Consult: Marchia Bond  History of Present Illness: Hannah Graves is a 69 y.o. female with a history of diverticulosis, hypertension, and diabetes admitted for rectal bleeding.  Per [facility] ED notes, patient presented for evaluation after passing dark red blood with 3 BMs this morning.  She endorsed a history of diverticulitosis and with rectal bleeding in 2013, receiving 2 units via transfusion at the time.  Significant labs included Hgb 11.5, MCV 77.5.  While in the ED, patient passed a large amount of blood with another BM and experienced a near-syncopal episode.  BP was found to be 53/24, which improved after starting IV fluids.  She was admitted for further evaluation and management.  Since admission, patients BP has remained stable between 94/56 and 127/58.  Hannah Graves reports mild SOB, dizziness, and generalized weakness, though improved from when she initially presented to the ED.  She has had two episodes of rectal bleeding since admission, but notes the volume of blood is smaller than the previous four episodes.  She continues to experience lower abdominal cramping prior to passing BRBPR, and also reports some moderate epigastric pain.  Patient notes she had been doing quite well until the rectal bleeding began yesterday.  She does not take any NSAIDs or blood thinners.  Denies dysphagia, reflux, diarrhea, abdominal pain, constipation, and hematochezia.  Also no family history of CCA, colon polyps, or other GI malignancy.  Of note, Hannah Graves underwent a colon resection from apparent diverticulitis with abscess formation per GI consult records from 2013.   Last colonoscopy: 09/16/11 [facility] - inpatient) - few small R-sided diverticuli w/o stigmata of bleeding, medium internal hemorrhoids Last endoscopy: none prior   Past Medical History:  Past Medical History  Diagnosis Date  . Diverticulitis   . Multiple  myeloma ([place])   . Hypertension   . Diabetes mellitus without complication ([place])   . Glaucoma   . Diverticulosis   . GI bleeding     Problem List: Patient Active Problem List   Diagnosis Date Noted  . GIB (gastrointestinal bleeding) 03/20/2015  . Hypotension 03/20/2015    Past Surgical History: Past Surgical History  Procedure Laterality Date  . Colon surgery    . Thyroidectomy    . Abdominal hysterectomy    . Cholecystectomy      Allergies: Allergies  Allergen Reactions  . Oxycodone Rash  . Sulfa Antibiotics Rash    Home Medications: Prescriptions prior to admission  Medication Sig Dispense Refill Last Dose  . aspirin EC 81 MG tablet Take 81 mg by mouth daily.   03/19/2015 at am  . atenolol (TENORMIN) 100 MG tablet Take 100 mg by mouth daily.   03/20/2015 at 0830  . Biotin 2500 MCG CAPS Take 2,500 mcg by mouth daily.   03/19/2015 at am  . cetirizine (ZYRTEC) 10 MG tablet Take 10 mg by mouth daily.   03/20/2015 at am  . fluticasone (FLONASE) 50 MCG/ACT nasal spray Place 2 sprays into both nostrils daily.   03/19/2015 at am  . furosemide (LASIX) 20 MG tablet Take 20 mg by mouth 2 (two) times daily.   03/20/2015 at am  . glipiZIDE (GLUCOTROL XL) 10 MG 24 hr tablet Take 10 mg by mouth 2 (two) times daily.   03/20/2015 at am  . losartan (COZAAR) 100 MG tablet Take 100 mg by mouth daily.   03/20/2015 at am  . vitamin C (ASCORBIC ACID) 500  MG tablet Take 500 mg by mouth daily.   03/19/2015 at am   Home medication reconciliation was completed with the patient.   Scheduled Inpatient Medications:   . fluticasone  2 spray Each Nare Daily  . insulin aspart  0-5 Units Subcutaneous QHS  . insulin aspart  0-9 Units Subcutaneous TID WC  . pantoprazole (PROTONIX) IV  40 mg Intravenous Q12H    Continuous Inpatient Infusions:   . sodium chloride      PRN Inpatient Medications:  acetaminophen **OR** acetaminophen, albuterol, ondansetron **OR** ondansetron (ZOFRAN) IV  Family  History: family history includes Diabetes in her father and mother; Heart attack in her father and mother; Hypertension in her father and mother.    Social History:   reports that she has quit smoking. She does not have any smokeless tobacco history on file. She reports that she does not drink alcohol.  Review of Systems: Constitutional: Weight is stable.  Eyes: No changes in vision. ENT: No oral lesions, sore throat.  GI: see HPI.  Heme/Lymph: No easy bruising.  CV: No chest pain.  GU: No hematuria.  Integumentary: No rashes.  Neuro: No headaches.  Psych: No depression/anxiety.  Endocrine: No heat/cold intolerance.  Allergic/Immunologic: No urticaria.  Resp: No cough, SOB.  Musculoskeletal: No joint swelling.    Physical Examination: BP 129/51 mmHg  Pulse 64  Temp(Src) 98 F (36.7 C) (Oral)  Resp 18  Ht 5\' 6"  (1.676 m)  Wt 105.235 kg (232 lb)  BMI 37.46 kg/m2  SpO2 100% Gen: NAD, alert and oriented x 4 HEENT: PEERLA, EOMI, Neck: supple, no JVD or thyromegaly Chest: CTA bilaterally, no wheezes, crackles, or other adventitious sounds CV: RRR, no m/g/c/r Abd: soft, mild epigastric > hypogastric and periumbilical TTP, ND, +BS in all four quadrants; no HSM, guarding, ridigity, or rebound tenderness Ext: no edema, well perfused with 2+ pulses, Skin: no rash or lesions noted Lymph: no LAD  Data: Lab Results  Component Value Date   WBC 4.8 03/20/2015   HGB 11.5* 03/20/2015   HCT 36.5 03/20/2015   MCV 77.5* 03/20/2015   PLT 247 03/20/2015    Recent Labs Lab 03/20/15 0943  HGB 11.5*   Lab Results  Component Value Date   NA 144 03/20/2015   K 5.2* 03/20/2015   CL 112* 03/20/2015   CO2 26 03/20/2015   BUN 21* 03/20/2015   CREATININE 1.24* 03/20/2015   Lab Results  Component Value Date   ALT 21 03/20/2015   AST 18 03/20/2015   ALKPHOS 50 03/20/2015   BILITOT 0.5 03/20/2015    Recent Labs Lab 03/20/15 0943  INR 1.04   Assessment/Plan: Hannah Graves is  a 69 y.o. female with a history of diverticulosis, hypertension, and diabetes admitted for rectal bleeding.  Thus far she has had 6 episodes of bleeding, 4 of which were fairly large (last resulting in significant hypotension and near syncope) and the last 2 of smaller volume per patient.  Hgb was 11.5 on admission, and vitals have remained stable with IVF after hypotensive episode in the ED.  Patient feels symptomatically improved, but was unsure if she was about to have another episode of rectal bleeding with a BM.  If this occurs, I recommend a tagged scan to identify the source of bleeding.  If there is no further rectal bleeding, I recommend OP follow-up to discuss colonoscopy.  Will continue to monitor labs and symptomatic progress.  Recommendations: - Monitor Hgb, transfuse if <7 - If Hgb and  hemodynamics stable, plan for outpatient follow-up to monitor Hgb and discuss colonoscopy - Recommend tagged scan if patient reports another episode of bleeding  Thank you for the consult. We will follow along with you. Please call with questions or concerns.  Lavera Guise, PA-C [facility] Phone: [phone] Pager: [phone]

## 2015-03-20 NOTE — ED Notes (Signed)
Pt had near syncope episode, o2 at 2 l/m, md at bedside, fluids started

## 2015-03-20 NOTE — ED Notes (Signed)
Pt up to bathroom, had large rectal bleeding,

## 2015-03-20 NOTE — H&P (Addendum)
[facility] [place] NAME: Hannah Graves    MR#:  [id number]  DATE OF BIRTH:  April 29, 1945  DATE OF ADMISSION:  03/20/2015  PRIMARY CARE PHYSICIAN: Artist Beach, MD   REQUESTING/REFERRING PHYSICIAN: Earleen Newport, MD  CHIEF COMPLAINT:   Chief Complaint  Patient presents with  . Rectal Bleeding   Rectal bleeding today. HISTORY OF PRESENT ILLNESS:  Hannah Graves  is a 69 y.o. female with a known history of GI bleeding with diverticulosis, hypertension and diabetes. The patient is started to have a rectal bleeding with the fresh blood noted this morning. She has total 3 episode of rectal bleeding at home and one episode in the ED hours ago. The patient also complains of the dizziness and generalized weakness with mild shortness of breath but she denies any chest pain or palpitation. She has nausea but no vomiting or diarrhea. The patient has hypotension 53/24 in ED. she had similar rectal bleeding due to diverticulosis in 2013 and got 2 units of blood transfusion.  PAST MEDICAL HISTORY:   Past Medical History  Diagnosis Date  . Diverticulitis   . Multiple myeloma ([place])   . Hypertension   . Diabetes mellitus without complication ([place])   . Glaucoma   . Diverticulosis   . GI bleeding     PAST SURGICAL HISTORY:   Past Surgical History  Procedure Laterality Date  . Colon surgery    . Thyroidectomy    . Abdominal hysterectomy    . Cholecystectomy      SOCIAL HISTORY:   Social History  Substance Use Topics  . Smoking status: Former Research scientist (life sciences)  . Smokeless tobacco: Not on file  . Alcohol Use: No    FAMILY HISTORY:   Family History  Problem Relation Age of Onset  . Heart attack Mother   . Hypertension Mother   . Diabetes Mother   . Heart attack Father   . Hypertension Father   . Diabetes Father     DRUG ALLERGIES:   Allergies  Allergen Reactions  . Oxycodone Rash  . Sulfa Antibiotics Rash    REVIEW OF  SYSTEMS:  CONSTITUTIONAL: No fever, has generalized weakness.  EYES: No blurred or double vision.  EARS, NOSE, AND THROAT: No tinnitus or ear pain.  RESPIRATORY: No cough, mild shortness of breath, no wheezing or hemoptysis.  CARDIOVASCULAR: No chest pain, orthopnea, edema.  GASTROINTESTINAL: Has nausea, no vomiting, diarrhea or abdominal pain. Has bloody stool. GENITOURINARY: No dysuria, hematuria.  ENDOCRINE: No polyuria, nocturia,  HEMATOLOGY: No anemia, easy bruising but has rectal bleeding SKIN: No rash or lesion. MUSCULOSKELETAL: No joint pain or arthritis.   NEUROLOGIC: No tingling, numbness, weakness.  PSYCHIATRY: No anxiety or depression.   MEDICATIONS AT HOME:   Prior to Admission medications   Medication Sig Start Date End Date Taking? Authorizing Provider  aspirin EC 81 MG tablet Take 81 mg by mouth daily.   Yes Historical Provider, MD  atenolol (TENORMIN) 100 MG tablet Take 100 mg by mouth daily.   Yes Historical Provider, MD  Biotin 2500 MCG CAPS Take 2,500 mcg by mouth daily.   Yes Historical Provider, MD  cetirizine (ZYRTEC) 10 MG tablet Take 10 mg by mouth daily.   Yes Historical Provider, MD  fluticasone (FLONASE) 50 MCG/ACT nasal spray Place 2 sprays into both nostrils daily.   Yes Historical Provider, MD  furosemide (LASIX) 20 MG tablet Take 20 mg by mouth 2 (two) times daily.   Yes  Historical Provider, MD  glipiZIDE (GLUCOTROL XL) 10 MG 24 hr tablet Take 10 mg by mouth 2 (two) times daily.   Yes Historical Provider, MD  losartan (COZAAR) 100 MG tablet Take 100 mg by mouth daily.   Yes Historical Provider, MD  vitamin C (ASCORBIC ACID) 500 MG tablet Take 500 mg by mouth daily.   Yes Historical Provider, MD      VITAL SIGNS:  Blood pressure 96/57, pulse 62, temperature 98.1 F (36.7 C), temperature source Oral, resp. rate 15, height 5' 6"  (1.676 m), weight 105.235 kg (232 lb), SpO2 100 %.  PHYSICAL EXAMINATION:  GENERAL:  69 y.o.-year-old patient lying in the  bed with no acute distress.  EYES: Pupils equal, round, reactive to light and accommodation. No scleral icterus. Extraocular muscles intact.  HEENT: Head atraumatic, normocephalic. Oropharynx and nasopharynx clear.  NECK:  Supple, no jugular venous distention. No thyroid enlargement, no tenderness.  LUNGS: Normal breath sounds bilaterally, no wheezing, rales,rhonchi or crepitation. No use of accessory muscles of respiration.  CARDIOVASCULAR: S1, S2 normal. No murmurs, rubs, or gallops.  ABDOMEN: Soft, tenderness on LLQ, nondistended. Bowel sounds present. No organomegaly or mass.  EXTREMITIES: No pedal edema, cyanosis, or clubbing.  NEUROLOGIC: Cranial nerves II through XII are intact. Muscle strength 5/5 in all extremities. Sensation intact. Gait not checked.  PSYCHIATRIC: The patient is alert and oriented x 3.  SKIN: No obvious rash, lesion, or ulcer.   LABORATORY PANEL:   CBC  Recent Labs Lab 03/20/15 0943  WBC 4.8  HGB 11.5*  HCT 36.5  PLT 247   ------------------------------------------------------------------------------------------------------------------  Chemistries   Recent Labs Lab 03/20/15 0943  NA 144  K 5.2*  CL 112*  CO2 26  GLUCOSE 162*  BUN 21*  CREATININE 1.24*  CALCIUM 8.7*  AST 18  ALT 21  ALKPHOS 50  BILITOT 0.5   ------------------------------------------------------------------------------------------------------------------  Cardiac Enzymes  Recent Labs Lab 03/20/15 0943  TROPONINI <0.03   ------------------------------------------------------------------------------------------------------------------  RADIOLOGY:  No results found.  EKG:   Orders placed or performed during the hospital encounter of 03/20/15  . ED EKG  . ED EKG    IMPRESSION AND PLAN:   Lower GI bleeding, possible due to diverticulosis Hypotension Mild hyperkalemia Dehydration Diabetes History of hypertension  The patient will be admitted to medical  floor. Keep nothing by mouth except medication, IV fluid support, Protonix IV twice a day and get GI consult. Follow-up hemoglobin every 6 hours. Hold aspirin. For hypotension, hold hypertension medication including atenolol, Lasix and losartan, continue IV fluid support. Bolus when necessary. For dehydration, continue IV fluid support and follow-up BMP. For diabetes, hold glipizide and start a sliding scale. I discussed with the Dr. Rayann Heman, GI physician, he suggested monitor hemoglobin, if the patient has active bleeding, get a bleeding scan.  All the records are reviewed and case discussed with ED provider. Management plans discussed with the patient, family and they are in agreement.  CODE STATUS: Full code  TOTAL TIME TAKING CARE OF THIS PATIENT: 57 minutes.    Demetrios Loll M.D on 03/20/2015 at 12:12 PM  Between 7am to 6pm - Pager - [phone]  After 6pm go to [email or web address] - password [email or web address] [facility]  Office  [phone]  CC: Primary care physician; Artist Beach, MD

## 2015-03-21 LAB — BASIC METABOLIC PANEL
ANION GAP: 5 (ref 5–15)
BUN: 15 mg/dL (ref 6–20)
CALCIUM: 7.8 mg/dL — AB (ref 8.9–10.3)
CHLORIDE: 116 mmol/L — AB (ref 101–111)
CO2: 24 mmol/L (ref 22–32)
CREATININE: 0.9 mg/dL (ref 0.44–1.00)
GFR calc non Af Amer: >60 mL/min (ref >60)
GLUCOSE: 106 mg/dL — AB (ref 65–99)
Potassium: 4 mmol/L (ref 3.5–5.1)
Sodium: 145 mmol/L (ref 135–145)

## 2015-03-21 LAB — CBC
HCT: 30 % — ABNORMAL LOW (ref 35.0–47.0)
HEMOGLOBIN: 9.7 g/dL — AB (ref 12.0–16.0)
MCH: 25.4 pg — AB (ref 26.0–34.0)
MCHC: 32.5 g/dL (ref 32.0–36.0)
MCV: 78.2 fL — AB (ref 80.0–100.0)
Platelets: 202 10*3/uL (ref 150–440)
RBC: 3.84 MIL/uL (ref 3.80–5.20)
RDW: 15.4 % — ABNORMAL HIGH (ref 11.5–14.5)
WBC: 5.4 10*3/uL (ref 3.6–11.0)

## 2015-03-21 LAB — GLUCOSE, CAPILLARY
GLUCOSE-CAPILLARY: 80 mg/dL (ref 65–99)
GLUCOSE-CAPILLARY: 99 mg/dL (ref 65–99)
Glucose-Capillary: 91 mg/dL (ref 65–99)
Glucose-Capillary: 204 mg/dL — ABNORMAL HIGH (ref 65–99)

## 2015-03-21 NOTE — Progress Notes (Signed)
[facility] [place] NAME: Hannah Graves    MR#:  [id number]  DATE OF BIRTH:  1946-03-10  SUBJECTIVE:  CHIEF COMPLAINT:   Chief Complaint  Patient presents with  . Rectal Bleeding   patient is a 69 year old African female with history of diverticular bleed in the past who presents to the hospital with recurrent rectal bleed. She feels good today. Denies any abdominal pain or recent rectal bleeding. She states that on arrival to emergency room, she had questions significant amount of bleeding, however, that has stopped now. Hemoglobin level drifted down from 11.5 on November 22 to 9.7 23rd of November. Blood pressure remains normal.   Review of Systems  Constitutional: Negative for fever, chills and weight loss.  HENT: Negative for congestion.   Eyes: Negative for blurred vision and double vision.  Respiratory: Negative for cough, sputum production, shortness of breath and wheezing.   Cardiovascular: Negative for chest pain, palpitations, orthopnea, leg swelling and PND.  Gastrointestinal: Negative for nausea, vomiting, abdominal pain, diarrhea, constipation and blood in stool.  Genitourinary: Negative for dysuria, urgency, frequency and hematuria.  Musculoskeletal: Negative for falls.  Neurological: Negative for dizziness, tremors, focal weakness and headaches.  Endo/Heme/Allergies: Does not bruise/bleed easily.  Psychiatric/Behavioral: Negative for depression. The patient does not have insomnia.     VITAL SIGNS: Blood pressure 143/54, pulse 84, temperature 98.4 F (36.9 C), temperature source Oral, resp. rate 16, height 5\' 6"  (1.676 m), weight 105.235 kg (232 lb), SpO2 98 %.  PHYSICAL EXAMINATION:   GENERAL:  69 y.o.-year-old patient lying in the bed with no acute distress.  EYES: Pupils equal, round, reactive to light and accommodation. No scleral icterus. Extraocular muscles intact.  HEENT: Head atraumatic, normocephalic.  Oropharynx and nasopharynx clear.  NECK:  Supple, no jugular venous distention. No thyroid enlargement, no tenderness.  LUNGS: Normal breath sounds bilaterally, no wheezing, rales,rhonchi or crepitation. No use of accessory muscles of respiration.  CARDIOVASCULAR: S1, S2 normal. No murmurs, rubs, or gallops.  ABDOMEN: Soft, nontender, nondistended. Bowel sounds present. No organomegaly or mass.  EXTREMITIES: No pedal edema, cyanosis, or clubbing.  NEUROLOGIC: Cranial nerves II through XII are intact. Muscle strength 5/5 in all extremities. Sensation intact. Gait not checked.  PSYCHIATRIC: The patient is alert and oriented x 3.  SKIN: No obvious rash, lesion, or ulcer.   ORDERS/RESULTS REVIEWED:   CBC  Recent Labs Lab 03/20/15 0943 03/20/15 1521 03/20/15 1811 03/21/15 0426  WBC 4.8  --   --  5.4  HGB 11.5* 10.1* 9.9* 9.7*  HCT 36.5  --   --  30.0*  PLT 247  --   --  202  MCV 77.5*  --   --  78.2*  MCH 24.4*  --   --  25.4*  MCHC 31.5*  --   --  32.5  RDW 15.7*  --   --  15.4*   ------------------------------------------------------------------------------------------------------------------  Chemistries   Recent Labs Lab 03/20/15 0943 03/21/15 0426  NA 144 145  K 5.2* 4.0  CL 112* 116*  CO2 26 24  GLUCOSE 162* 106*  BUN 21* 15  CREATININE 1.24* 0.90  CALCIUM 8.7* 7.8*  AST 18  --   ALT 21  --   ALKPHOS 50  --   BILITOT 0.5  --    ------------------------------------------------------------------------------------------------------------------ estimated creatinine clearance is 73.4 mL/min (by C-G formula based on Cr of 0.9). ------------------------------------------------------------------------------------------------------------------ No results for input(s): TSH, T4TOTAL, T3FREE, THYROIDAB in the last  72 hours.  Invalid input(s): FREET3  Cardiac Enzymes  Recent Labs Lab 03/20/15 0943  TROPONINI <0.03    ------------------------------------------------------------------------------------------------------------------ Invalid input(s): POCBNP ---------------------------------------------------------------------------------------------------------------  RADIOLOGY: Nm Gi Blood Loss  03/20/2015  CLINICAL DATA:  Active GI bleed since 4 a.m. History of GI bleed in 2013. EXAM: NUCLEAR MEDICINE GASTROINTESTINAL BLEEDING SCAN TECHNIQUE: Sequential abdominal images were obtained following intravenous administration of Tc-49m labeled red blood cells. RADIOPHARMACEUTICALS:  20.7 mCi Tc-24m in-vitro labeled red cells. COMPARISON:  GI bleeding scan 09/12/2011. CT abdomen and pelvis 09/12/2011. FINDINGS: Images are obtained out to 2 hours following radiotracer administration. No abnormal radiotracer accumulation is demonstrated. Location or presence of GI bleeding cannot be confirmed. Normal blood pool and vascular activity. Activity demonstrated in the urinary bladder. IMPRESSION: No abnormal radiotracer activity identified. No GI bleed identified. Electronically Signed   By: Lucienne Capers M.D.   On: 03/20/2015 22:22    EKG:  Orders placed or performed during the hospital encounter of 03/20/15  . ED EKG  . ED EKG  . EKG 12-Lead  . EKG 12-Lead    ASSESSMENT AND PLAN:  Principal Problem:   GIB (gastrointestinal bleeding) Active Problems:   Hypotension 1. Recurrent diverticular bleed, initiate patient on clear liquid diet, nothing red, awaiting for GI consult, bleeding scan was negative, may repeat if bleeding recurs 2. Acute posthemorrhagic anemia, following hemoglobin level daily and transfuse as needed. Initiate iron supplementation upon discharge 3. Diabetes mellitus, blood glucose levels are ranging between 80-200, continue sliding scale insulin 4. Essential hypertension, continue outpatient medications. Patient's blood pressure readings are stable   Management plans discussed with the  patient, family and they are in agreement.   DRUG ALLERGIES:  Allergies  Allergen Reactions  . Oxycodone Rash  . Sulfa Antibiotics Rash    CODE STATUS:     Code Status Orders        Start     Ordered   03/20/15 1341  Full code   Continuous     03/20/15 1340      TOTAL TIME TAKING CARE OF THIS PATIENT: 35 minutes.    Theodoro Grist M.D on 03/21/2015 at 3:47 PM  Between 7am to 6pm - Pager - [phone]  After 6pm go to [email or web address] - password [email or web address] [facility]  Office  [phone]  CC: Primary care physician; Artist Beach, MD

## 2015-03-21 NOTE — Progress Notes (Signed)
GI Inpatient Follow-up Note  Patient Identification: Hannah Graves is a 69 y.o. female with LGIB.   Subjective:  Tagged scan negative.  No further bleeding overnight. Small amt old blood this am.  Feels better. No sob, c/p, n/v.   Scheduled Inpatient Medications:  . fluticasone  2 spray Each Nare Daily  . insulin aspart  0-5 Units Subcutaneous QHS  . insulin aspart  0-9 Units Subcutaneous TID WC  . pantoprazole (PROTONIX) IV  40 mg Intravenous Q12H    Continuous Inpatient Infusions:     PRN Inpatient Medications:  acetaminophen **OR** acetaminophen, albuterol, ondansetron **OR** ondansetron (ZOFRAN) IV    Physical Examination: BP 149/63 mmHg  Pulse 88  Temp(Src) 98 F (36.7 C) (Oral)  Resp 18  Ht 5\' 6"  (1.676 m)  Wt 105.235 kg (232 lb)  BMI 37.46 kg/m2  SpO2 98% Gen: NAD, alert and oriented x 4 HEENT: PEERLA, EOMI, Neck: supple, no JVD or thyromegaly Chest: CTA bilaterally, no wheezes, crackles, or other adventitious sounds CV: RRR, no m/g/c/r Abd: soft, NT, ND, +BS in all four quadrants; no HSM, guarding, ridigity, or rebound tenderness Ext: no edema, well perfused with 2+ pulses, Skin: no rash or lesions noted Lymph: no LAD  Data: Lab Results  Component Value Date   WBC 5.4 03/21/2015   HGB 9.7* 03/21/2015   HCT 30.0* 03/21/2015   MCV 78.2* 03/21/2015   PLT 202 03/21/2015    Recent Labs Lab 03/20/15 1521 03/20/15 1811 03/21/15 0426  HGB 10.1* 9.9* 9.7*   Lab Results  Component Value Date   NA 145 03/21/2015   K 4.0 03/21/2015   CL 116* 03/21/2015   CO2 24 03/21/2015   BUN 15 03/21/2015   CREATININE 0.90 03/21/2015   Lab Results  Component Value Date   ALT 21 03/20/2015   AST 18 03/20/2015   ALKPHOS 50 03/20/2015   BILITOT 0.5 03/20/2015    Recent Labs Lab 03/20/15 0943  INR 1.04   Assessment/Plan: Hannah Graves is a 69 y.o. female with LGIB.   Bleeding has nearly resolved, tagged scan negative, and hgb nearly stable. Likely  resolved diverticular bleed.   Recommendations: - f/u in GI clinic for discussion of colonoscopy.  - safe for d/c once Hgb stable.   Please call with questions or concerns.  Marysue Fait, Grace Blight, MD

## 2015-03-22 DIAGNOSIS — D62 Acute posthemorrhagic anemia: Secondary | ICD-10-CM

## 2015-03-22 DIAGNOSIS — E119 Type 2 diabetes mellitus without complications: Secondary | ICD-10-CM

## 2015-03-22 DIAGNOSIS — I10 Essential (primary) hypertension: Secondary | ICD-10-CM

## 2015-03-22 LAB — GLUCOSE, CAPILLARY
GLUCOSE-CAPILLARY: 141 mg/dL — AB (ref 65–99)
Glucose-Capillary: 117 mg/dL — ABNORMAL HIGH (ref 65–99)
Glucose-Capillary: 123 mg/dL — ABNORMAL HIGH (ref 65–99)
Glucose-Capillary: 154 mg/dL — ABNORMAL HIGH (ref 65–99)

## 2015-03-22 LAB — HEMOGLOBIN: HEMOGLOBIN: 9.3 g/dL — AB (ref 12.0–16.0)

## 2015-03-22 MED ORDER — LOSARTAN POTASSIUM 50 MG PO TABS
100.0000 mg | ORAL_TABLET | Freq: Every day | ORAL | Status: DC
Start: 2015-03-22 — End: 2015-03-22
  Administered 2015-03-22: 100 mg via ORAL
  Filled 2015-03-22: qty 2

## 2015-03-22 MED ORDER — ATENOLOL 50 MG PO TABS
100.0000 mg | ORAL_TABLET | Freq: Every day | ORAL | Status: DC
  Administered 2015-03-22: 100 mg via ORAL
  Filled 2015-03-22: qty 2

## 2015-03-22 MED ORDER — PANTOPRAZOLE SODIUM 40 MG PO TBEC: 40.0000 mg | DELAYED_RELEASE_TABLET | Freq: Two times a day (BID) | ORAL | Status: DC

## 2015-03-22 NOTE — Progress Notes (Signed)
Patient A/O, no noted distress. Denies pain. Tolerated all meds. Patient requested not to awaken during the night resulted she couldn't sleep the night before. BP WNL. Staff will continue to monitor and meet needs. Patient slept well throughout the night.

## 2015-03-22 NOTE — Progress Notes (Signed)
03/22/2015 5:39 PM  Alvis Lemmings to be D/C'd Home per MD order.  Discussed prescriptions and follow up appointments with the patient. Prescriptions given to patient, medication list explained in detail. Pt verbalized understanding.    Medication List    STOP taking these medications        aspirin EC 81 MG tablet      TAKE these medications        atenolol 100 MG tablet  Commonly known as:  TENORMIN  Take 100 mg by mouth daily.     Biotin 2500 MCG Caps  Take 2,500 mcg by mouth daily.     cetirizine 10 MG tablet  Commonly known as:  ZYRTEC  Take 10 mg by mouth daily.     fluticasone 50 MCG/ACT nasal spray  Commonly known as:  FLONASE  Place 2 sprays into both nostrils daily.     furosemide 20 MG tablet  Commonly known as:  LASIX  Take 20 mg by mouth 2 (two) times daily.     glipiZIDE 10 MG 24 hr tablet  Commonly known as:  GLUCOTROL XL  Take 10 mg by mouth 2 (two) times daily.     losartan 100 MG tablet  Commonly known as:  COZAAR  Take 100 mg by mouth daily.     pantoprazole 40 MG tablet  Commonly known as:  PROTONIX  Take 1 tablet (40 mg total) by mouth 2 (two) times daily. Switch for any other PPI at similar dose and frequency     vitamin C 500 MG tablet  Commonly known as:  ASCORBIC ACID  Take 500 mg by mouth daily.        Filed Vitals:   03/22/15 0531 03/22/15 1244  BP: 173/59 181/68  Pulse: 80 89  Temp: 97.8 F (36.6 C) 98.3 F (36.8 C)  Resp:  18    Skin clean, dry and intact without evidence of skin break down, no evidence of skin tears noted. IV catheter discontinued intact. Site without signs and symptoms of complications. Dressing and pressure applied. Pt denies pain at this time. No complaints noted.  An After Visit Summary was printed and given to the patient. Patient escorted via [place], and D/C home via private auto.  Dola Argyle

## 2015-03-23 NOTE — Discharge Summary (Signed)
[facility] [place] NAME: Hannah Graves    MR#:  [id number]  DATE OF BIRTH:  [birth year]  DATE OF ADMISSION:  03/20/2015 ADMITTING PHYSICIAN: Demetrios Loll, MD  DATE OF DISCHARGE: 03/22/2015  5:43 PM  PRIMARY CARE PHYSICIAN: Artist Beach, MD     ADMISSION DIAGNOSIS:  Rectal bleeding [K62.5]  DISCHARGE DIAGNOSIS:  Principal Problem:   GIB (gastrointestinal bleeding) Active Problems:   Hypotension   Acute posthemorrhagic anemia   Essential hypertension   Diabetes mellitus ([place])   SECONDARY DIAGNOSIS:   Past Medical History  Diagnosis Date  . Diverticulitis   . Multiple myeloma ([place])   . Hypertension   . Diabetes mellitus without complication ([place])   . Glaucoma   . Diverticulosis   . GI bleeding     .pro HOSPITAL COURSE:  The patient is a 69 year old African female with history of lower GI bleed, thought to be diverticular bleed in the past who presents to the hospital with recurrent rectal bleed. She denied abdominal pain/  Hemoglobin level drifted down from 11.5 on November 22 to 9.7 23rd of November. Blood pressure remained normal. Initially patient was kept NPO, but when bleeding stopped, diet was advanced and since patient had no recurrent bleeding, she was felt to be stable to be discharged home. Of note, GI bleeding scan was negative.patient was seen and followed by gastroenterologist and was recommended to follow up as outpatient. Discussion  By problem: 1.. Recurrent lower GI bleeding, suspected diverticular bleed, appreciate GI consult, follow up as outpatient was recommended,  bleeding scan was negative, bleeding did not recur with advancement of diet, patient is being discharged home 11.24.16 2. Acute posthemorrhagic anemia, follow hemoglobin level as outpatient, the was no indication to transfuse. Initiated iron supplementation upon discharge 3. Diabetes mellitus, blood glucose levels are ranging between 80-200,  continue sliding scale insulin, resume home medications. 4. Essential hypertension, continue outpatient medications. Patient's blood pressure was high while holding medications, resume  DISCHARGE CONDITIONS:   stable  CONSULTS OBTAINED:  Treatment Team:  Josefine Class, MD  DRUG ALLERGIES:   Allergies  Allergen Reactions  . Oxycodone Rash  . Sulfa Antibiotics Rash    DISCHARGE MEDICATIONS:   Discharge Medication List as of 03/22/2015  4:38 PM    START taking these medications   Details  pantoprazole (PROTONIX) 40 MG tablet Take 1 tablet (40 mg total) by mouth 2 (two) times daily. Switch for any other PPI at similar dose and frequency, Starting 03/22/2015, Until Discontinued, Print      CONTINUE these medications which have NOT CHANGED   Details  atenolol (TENORMIN) 100 MG tablet Take 100 mg by mouth daily., Until Discontinued, Historical Med    Biotin 2500 MCG CAPS Take 2,500 mcg by mouth daily., Until Discontinued, Historical Med    cetirizine (ZYRTEC) 10 MG tablet Take 10 mg by mouth daily., Until Discontinued, Historical Med    fluticasone (FLONASE) 50 MCG/ACT nasal spray Place 2 sprays into both nostrils daily., Until Discontinued, Historical Med    furosemide (LASIX) 20 MG tablet Take 20 mg by mouth 2 (two) times daily., Until Discontinued, Historical Med    glipiZIDE (GLUCOTROL XL) 10 MG 24 hr tablet Take 10 mg by mouth 2 (two) times daily., Until Discontinued, Historical Med    losartan (COZAAR) 100 MG tablet Take 100 mg by mouth daily., Until Discontinued, Historical Med    vitamin C (ASCORBIC ACID) 500 MG tablet Take 500 mg by mouth  daily., Until Discontinued, Historical Med      STOP taking these medications     aspirin EC 81 MG tablet          DISCHARGE INSTRUCTIONS:    Follow up with PCP, gastroenterology as outpatient  If you experience worsening of your admission symptoms, develop shortness of breath, life threatening emergency, suicidal  or homicidal thoughts you must seek medical attention immediately by calling 911 or calling your MD immediately  if symptoms less severe.  You Must read complete instructions/literature along with all the possible adverse reactions/side effects for all the Medicines you take and that have been prescribed to you. Take any new Medicines after you have completely understood and accept all the possible adverse reactions/side effects.   Please note  You were cared for by a hospitalist during your hospital stay. If you have any questions about your discharge medications or the care you received while you were in the hospital after you are discharged, you can call the unit and asked to speak with the hospitalist on call if the hospitalist that took care of you is not available. Once you are discharged, your primary care physician will handle any further medical issues. Please note that NO REFILLS for any discharge medications will be authorized once you are discharged, as it is imperative that you return to your primary care physician (or establish a relationship with a primary care physician if you do not have one) for your aftercare needs so that they can reassess your need for medications and monitor your lab values.    Today   CHIEF COMPLAINT:   Chief Complaint  Patient presents with  . Rectal Bleeding    HISTORY OF PRESENT ILLNESS:  Hannah Graves  is a 69 y.o. female female with a known history of lower GI bleed, thought to be diverticular bleed in the past who presents to the hospital with recurrent rectal bleed. She denied abdominal pain/  Hemoglobin level drifted down from 11.5 on November 22 to 9.7 23rd of November. Blood pressure remained normal. Initially patient was kept NPO, but when bleeding stopped, diet was advanced and since patient had no recurrent bleeding, she was felt to be stable to be discharged home. Of note, GI bleeding scan was negative.patient was seen and followed by  gastroenterologist and was recommended to follow up as outpatient. Discussion  By problem: 1.. Recurrent lower GI bleeding, suspected diverticular bleed, appreciate GI consult, follow up as outpatient was recommended,  bleeding scan was negative, bleeding did not recur with advancement of diet, patient is being discharged home 11.24.16 2. Acute posthemorrhagic anemia, follow hemoglobin level as outpatient, the was no indication to transfuse. Initiated iron supplementation upon discharge 3. Diabetes mellitus, blood glucose levels are ranging between 80-200, continue sliding scale insulin, resume home medications. 4. Essential hypertension, continue outpatient medications. Patient's blood pressure was high while holding medications, resume    VITAL SIGNS:  Blood pressure 181/68, pulse 89, temperature 98.3 F (36.8 C), temperature source Oral, resp. rate 18, height 5' 6"  (1.676 m), weight 105.235 kg (232 lb), SpO2 100 %.  I/O:  No intake or output data in the 24 hours ending 03/23/15 1706  PHYSICAL EXAMINATION:  GENERAL:  69 y.o.-year-old patient lying in the bed with no acute distress.  EYES: Pupils equal, round, reactive to light and accommodation. No scleral icterus. Extraocular muscles intact.  HEENT: Head atraumatic, normocephalic. Oropharynx and nasopharynx clear.  NECK:  Supple, no jugular venous distention. No thyroid enlargement, no tenderness.  LUNGS: Normal breath sounds bilaterally, no wheezing, rales,rhonchi or crepitation. No use of accessory muscles of respiration.  CARDIOVASCULAR: S1, S2 normal. No murmurs, rubs, or gallops.  ABDOMEN: Soft, non-tender, non-distended. Bowel sounds present. No organomegaly or mass.  EXTREMITIES: No pedal edema, cyanosis, or clubbing.  NEUROLOGIC: Cranial nerves II through XII are intact. Muscle strength 5/5 in all extremities. Sensation intact. Gait not checked.  PSYCHIATRIC: The patient is alert and oriented x 3.  SKIN: No obvious rash, lesion,  or ulcer.   DATA REVIEW:   CBC  Recent Labs Lab 03/21/15 0426 03/22/15 0859  WBC 5.4  --   HGB 9.7* 9.3*  HCT 30.0*  --   PLT 202  --     Chemistries   Recent Labs Lab 03/20/15 0943 03/21/15 0426  NA 144 145  K 5.2* 4.0  CL 112* 116*  CO2 26 24  GLUCOSE 162* 106*  BUN 21* 15  CREATININE 1.24* 0.90  CALCIUM 8.7* 7.8*  AST 18  --   ALT 21  --   ALKPHOS 50  --   BILITOT 0.5  --     Cardiac Enzymes  Recent Labs Lab 03/20/15 [place] <0.03    Microbiology Results  No results found for this or any previous visit.  RADIOLOGY:  No results found.  EKG:   Orders placed or performed during the hospital encounter of 03/20/15  . ED EKG  . ED EKG  . EKG 12-Lead  . EKG 12-Lead      Management plans discussed with the patient, family and they are in agreement.  CODE STATUS:   TOTAL TIME TAKING CARE OF THIS PATIENT: 40 minutes.    Theodoro Grist M.D on 03/23/2015 at 5:06 PM  Between 7am to 6pm - Pager - [phone]  After 6pm go to [email or web address] - password [email or web address] [facility]  Office  [phone]  CC: Primary care physician; Artist Beach, MD

## 2016-08-28 ENCOUNTER — Ambulatory Visit (INDEPENDENT_AMBULATORY_CARE_PROVIDER_SITE_OTHER): Payer: Medicare HMO

## 2016-08-28 ENCOUNTER — Encounter: Payer: Self-pay | Admitting: Podiatry

## 2016-08-28 ENCOUNTER — Ambulatory Visit (INDEPENDENT_AMBULATORY_CARE_PROVIDER_SITE_OTHER): Payer: Medicare HMO | Admitting: Podiatry

## 2016-08-28 VITALS — Resp 16 | Ht 65.0 in | Wt 226.0 lb

## 2016-08-28 DIAGNOSIS — M25571 Pain in right ankle and joints of right foot: Secondary | ICD-10-CM

## 2016-08-28 DIAGNOSIS — M722 Plantar fascial fibromatosis: Secondary | ICD-10-CM | POA: Diagnosis not present

## 2016-08-28 DIAGNOSIS — S92221D Displaced fracture of lateral cuneiform of right foot, subsequent encounter for fracture with routine healing: Secondary | ICD-10-CM

## 2016-08-28 DIAGNOSIS — M79671 Pain in right foot: Secondary | ICD-10-CM

## 2016-08-28 MED ORDER — TRIAMCINOLONE ACETONIDE 10 MG/ML IJ SUSP
10.0000 mg | Freq: Once | INTRAMUSCULAR | Status: AC
  Administered 2016-08-28: 10 mg

## 2016-08-29 NOTE — Progress Notes (Signed)
Subjective:    Patient ID: Hannah Graves, female   DOB: 71 y.o.   MRN: [id number]   HPI patient presents stating that she twisted her foot about a month ago and her ankle started to bother her and she is getting quite a bit of pain in her heelThat is making walking difficult    Review of Systems  All other systems reviewed and are negative.       Objective:  Physical Exam  Cardiovascular: Intact distal pulses.   Musculoskeletal: Normal range of motion.  Neurological: She is alert.  Skin: Skin is warm.  Nursing note and vitals reviewed.  neurovascular found to be intact muscle strengths within normal limits. Patient's found to have discomfort in the outside of the right ankle around the fibular malleolus and slightly distal. There is also quite a bit of discomfort in the plantar heel which is making her walk differently can treating to the pain in the outside of the foot area patient's found have good digital perfusion well oriented 3     Assessment:  Possibility for fracture of the right distal ankle versus spur formation and may been present and plantar fasciitis right       Plan:    H&P and x-ray reviewed with patient. At this point I'm an immobilizer and boot that she has at home and I did go ahead and injected the plantar fascia 3 Mill grams Kenalog 5 mill grams Xylocaine. Discussed the bone fragment I saw on the lateral view and will have to watch that and decide whether or not that is contributory to her problem depending how she responds to immobilization and reduction of plantar fascial pain  X-ray indicated there is what's possible to have free-floating bone chip on the lateral side of the right foot that may or may not be new secondary to injury or may be something that an artifact

## 2016-09-18 ENCOUNTER — Encounter: Payer: Self-pay | Admitting: Podiatry

## 2016-09-18 ENCOUNTER — Ambulatory Visit (INDEPENDENT_AMBULATORY_CARE_PROVIDER_SITE_OTHER): Payer: Medicare HMO | Admitting: Podiatry

## 2016-09-18 DIAGNOSIS — M722 Plantar fascial fibromatosis: Secondary | ICD-10-CM

## 2016-09-18 DIAGNOSIS — M779 Enthesopathy, unspecified: Secondary | ICD-10-CM

## 2016-09-18 MED ORDER — TRIAMCINOLONE ACETONIDE 10 MG/ML IJ SUSP
10.0000 mg | Freq: Once | INTRAMUSCULAR | Status: AC
  Administered 2016-09-18: 10 mg

## 2016-09-18 NOTE — Progress Notes (Signed)
Subjective:    Patient ID: Hannah Graves, female   DOB: 71 y.o.   MRN: [id number]   HPI patient states the heels feeling quite a bit better with some discomfort on the outside of the right ankle but still present but improved from previous visit    ROS      Objective:  Physical Exam Neurovascular status intact with improved right plantar fashion with mild discomfort posterior plantar aspect of the right ankle    Assessment:    Possibility that this is an inflammatory condition or it could be possible 2 small bone spur that's present. At this point I want to try to avoid surgery to a careful sheath injection right and explained if it does not get better this may require excision. She will wear her boot as she can't be seen back in 2 weeks     Plan:    Above-mentioned injection accomplished

## 2016-10-16 ENCOUNTER — Ambulatory Visit: Payer: Medicare HMO | Admitting: Podiatry

## 2023-10-15 ENCOUNTER — Encounter: Payer: Self-pay | Admitting: Ophthalmology

## 2023-10-20 NOTE — Discharge Instructions (Signed)

## 2023-10-21 ENCOUNTER — Other Ambulatory Visit: Payer: Self-pay

## 2023-10-21 ENCOUNTER — Ambulatory Visit
Admission: RE | Admit: 2023-10-21 | Discharge: 2023-10-21 | Disposition: A | Discharge status: Home / Self Care | Attending: Ophthalmology | Admitting: Ophthalmology

## 2023-10-21 ENCOUNTER — Ambulatory Visit: Payer: Self-pay | Admitting: Anesthesiology

## 2023-10-21 ENCOUNTER — Encounter: Payer: Self-pay | Admitting: Ophthalmology

## 2023-10-21 ENCOUNTER — Encounter
Admission: RE | Disposition: A | Discharge status: Home / Self Care | Payer: Self-pay | Source: Home / Self Care | Attending: Ophthalmology

## 2023-10-21 DIAGNOSIS — Z7984 Long term (current) use of oral hypoglycemic drugs: Secondary | ICD-10-CM | POA: Diagnosis not present

## 2023-10-21 DIAGNOSIS — E1136 Type 2 diabetes mellitus with diabetic cataract: Secondary | ICD-10-CM | POA: Diagnosis not present

## 2023-10-21 DIAGNOSIS — H25011 Cortical age-related cataract, right eye: Secondary | ICD-10-CM | POA: Diagnosis present

## 2023-10-21 DIAGNOSIS — H2511 Age-related nuclear cataract, right eye: Secondary | ICD-10-CM | POA: Diagnosis present

## 2023-10-21 HISTORY — PX: CATARACT EXTRACTION W/PHACO: SHX586

## 2023-10-21 LAB — GLUCOSE, CAPILLARY: Glucose-Capillary: 139 mg/dL — ABNORMAL HIGH (ref 70–99)

## 2023-10-21 SURGERY — PHACOEMULSIFICATION, CATARACT, WITH IOL INSERTION
Anesthesia: Monitor Anesthesia Care | Site: Eye | Laterality: Right

## 2023-10-21 MED ORDER — SIGHTPATH DOSE#1 NA HYALUR & NA CHOND-NA HYALUR IO KIT
PACK | INTRAOCULAR | Status: DC | PRN
  Administered 2023-10-21: 1 via OPHTHALMIC

## 2023-10-21 MED ORDER — ARMC OPHTHALMIC DILATING DROPS
OPHTHALMIC | Status: AC
  Filled 2023-10-21: qty 0.5

## 2023-10-21 MED ORDER — SIGHTPATH DOSE#1 BSS IO SOLN
INTRAOCULAR | Status: DC | PRN
  Administered 2023-10-21: 15 mL via INTRAOCULAR

## 2023-10-21 MED ORDER — CEFUROXIME OPHTHALMIC INJECTION 1 MG/0.1 ML
INJECTION | OPHTHALMIC | Status: DC | PRN
  Administered 2023-10-21: 1 mg via INTRACAMERAL

## 2023-10-21 MED ORDER — FENTANYL CITRATE (PF) 100 MCG/2ML IJ SOLN
INTRAMUSCULAR | Status: AC
  Filled 2023-10-21: qty 2

## 2023-10-21 MED ORDER — FENTANYL CITRATE (PF) 100 MCG/2ML IJ SOLN
INTRAMUSCULAR | Status: DC | PRN
  Administered 2023-10-21 (×2): 50 ug via INTRAVENOUS

## 2023-10-21 MED ORDER — LIDOCAINE HCL (PF) 2 % IJ SOLN
INTRAOCULAR | Status: DC | PRN
  Administered 2023-10-21: 2 mL

## 2023-10-21 MED ORDER — MIDAZOLAM HCL 2 MG/2ML IJ SOLN
INTRAMUSCULAR | Status: AC
  Filled 2023-10-21: qty 2

## 2023-10-21 MED ORDER — ARMC OPHTHALMIC DILATING DROPS
1.0000 | OPHTHALMIC | Status: DC | PRN
  Administered 2023-10-21 (×3): 1 via OPHTHALMIC

## 2023-10-21 MED ORDER — TETRACAINE HCL 0.5 % OP SOLN
OPHTHALMIC | Status: AC
Start: 2023-10-21 — End: 2023-10-21
  Filled 2023-10-21: qty 4

## 2023-10-21 MED ORDER — MIDAZOLAM HCL 2 MG/2ML IJ SOLN
INTRAMUSCULAR | Status: DC | PRN
  Administered 2023-10-21: 2 mg via INTRAVENOUS

## 2023-10-21 MED ORDER — TETRACAINE HCL 0.5 % OP SOLN
1.0000 [drp] | OPHTHALMIC | Status: DC | PRN
  Administered 2023-10-21 (×3): 1 [drp] via OPHTHALMIC

## 2023-10-21 MED ORDER — BRIMONIDINE TARTRATE 0.15 % OP SOLN
OPHTHALMIC | Status: DC | PRN
  Administered 2023-10-21: 1 [drp] via OPHTHALMIC

## 2023-10-21 MED ORDER — SIGHTPATH DOSE#1 BSS IO SOLN
INTRAOCULAR | Status: DC | PRN
  Administered 2023-10-21: 58 mL via OPHTHALMIC

## 2023-10-21 SURGICAL SUPPLY — 10 items
CATARACT SUITE SIGHTPATH (MISCELLANEOUS) ×1 IMPLANT
FEE CATARACT SUITE SIGHTPATH (MISCELLANEOUS) ×1 IMPLANT
GLOVE BIOGEL PI IND STRL 8 (GLOVE) ×1 IMPLANT
GLOVE SURG LX STRL 7.5 STRW (GLOVE) ×1 IMPLANT
GLOVE SURG PROTEXIS BL SZ6.5 (GLOVE) ×1 IMPLANT
GLOVE SURG SYN 6.5 PF PI BL (GLOVE) ×1 IMPLANT
LENS IOL TECNIS EYHANCE 21.0 (Intraocular Lens) IMPLANT
NDL FILTER BLUNT 18X1 1/2 (NEEDLE) ×1 IMPLANT
NEEDLE FILTER BLUNT 18X1 1/2 (NEEDLE) ×1 IMPLANT
SYR 3ML LL SCALE MARK (SYRINGE) ×1 IMPLANT

## 2023-10-21 NOTE — Transfer of Care (Signed)
 Immediate Anesthesia Transfer of Care Note  Patient: KAEDENCE CONNELLY  Procedure(s) Performed: PHACOEMULSIFICATION, CATARACT, WITH IOL INSERTION 6.18 00:37.8 (Right: Eye)  Patient Location: PACU  Anesthesia Type: MAC  Level of Consciousness: awake, alert  and patient cooperative  Airway and Oxygen Therapy: Patient Spontanous Breathing and Patient connected to supplemental oxygen  Post-op Assessment: Post-op Vital signs reviewed, Patient's Cardiovascular Status Stable, Respiratory Function Stable, Patent Airway and No signs of Nausea or vomiting  Post-op Vital Signs: Reviewed and stable  Complications: No notable events documented.

## 2023-10-21 NOTE — H&P (Signed)
 [facility]   Primary Care Physician:  Cleotilde Ned, MD Ophthalmologist: Dr. Dene Etienne  Pre-Procedure History & Physical: HPI:  Hannah Graves is a 78 y.o. female here for ophthalmic surgery.   Past Medical History:  Diagnosis Date   Diabetes mellitus without complication (HCC)    Diverticulitis    Diverticulosis    GI bleeding    Glaucoma    Hypertension    Multiple myeloma (HCC)     Past Surgical History:  Procedure Laterality Date   ABDOMINAL HYSTERECTOMY     CHOLECYSTECTOMY     COLON SURGERY     THYROIDECTOMY      Prior to Admission medications   Medication Sig Start Date End Date Taking? Authorizing Provider  acetaminophen  (TYLENOL ) 650 MG CR tablet Take 650 mg by mouth every 8 (eight) hours as needed for pain.   Yes [provider]  albuterol  (ACCUNEB ) 1.25 MG/3ML nebulizer solution Take 1 ampule by nebulization every 6 (six) hours as needed for wheezing.   Yes [provider]  Biotin 2500 MCG CAPS Take 2,500 mcg by mouth daily.   Yes [provider]  Cholecalciferol (VITAMIN D3) 1000 units CAPS Take 1 capsule by mouth daily.   Yes [provider]  DILT-XR 180 MG 24 hr capsule  06/09/16  Yes [provider]  fluticasone  (FLONASE ) 50 MCG/ACT nasal spray Place 2 sprays into both nostrils daily.   Yes [provider]  furosemide (LASIX) 40 MG tablet Take 40 mg by mouth 2 (two) times daily.   Yes [provider]  gabapentin (NEURONTIN) 300 MG capsule Take 300 mg by mouth 3 (three) times daily.   Yes [provider]  glipiZIDE (GLUCOTROL XL) 10 MG 24 hr tablet Take 10 mg by mouth 2 (two) times daily.   Yes [provider]  losartan  (COZAAR ) 100 MG tablet Take 100 mg by mouth daily.   Yes [provider]  montelukast (SINGULAIR) 10 MG tablet Take 10 mg by mouth at bedtime.   Yes [provider]  tiotropium (SPIRIVA) 18 MCG inhalation capsule Place 18 mcg  into inhaler and inhale daily.   Yes [provider]  vitamin C (ASCORBIC ACID) 500 MG tablet Take 500 mg by mouth daily.   Yes [provider]    Allergies as of 10/08/2023 - Review Complete 09/18/2016  Allergen Reaction Noted   Oxycodone Rash 03/20/2015   Sulfa antibiotics Rash 03/20/2015    Family History  Problem Relation Age of Onset   Heart attack Mother    Hypertension Mother    Diabetes Mother    Heart attack Father    Hypertension Father    Diabetes Father     Social History   Socioeconomic History   Marital status: Single    Spouse name: Not on file   Number of children: Not on file   Years of education: Not on file   Highest education level: Not on file  Occupational History   Not on file  Tobacco Use   Smoking status: Former   Smokeless tobacco: Not on file  Substance and Sexual Activity   Alcohol use: No   Drug use: Not on file   Sexual activity: Not on file  Other Topics Concern   Not on file  Social History Narrative   Not on file   Social Drivers of Health   Financial Resource Strain: Low Risk  (09/08/2022)   Received from [facility]   Overall  Financial Resource Strain (CARDIA)    Difficulty of Paying Living Expenses: Not hard at all  Food Insecurity: No Food Insecurity (09/08/2022)   Received from [facility]   Hunger Vital Sign    Within the past 12 months, you worried that your food would run out before you got the money to buy more.: Never true    Within the past 12 months, the food you bought just didn't last and you didn't have money to get more.: Never true  Transportation Needs: No Transportation Needs (09/08/2022)   Received from [facility]   PRAPARE - Transportation    Lack of Transportation (Medical): No    Lack of Transportation (Non-Medical): No  Physical Activity: Inactive (08/31/2020)   Received from [facility]   Exercise Vital Sign    On average, how many days per week do you engage in  moderate to strenuous exercise (like a brisk walk)?: 0 days    On average, how many minutes do you engage in exercise at this level?: 0 min  Stress: Not on file  Social Connections: Not on file  Intimate Partner Violence: Not At Risk (09/08/2022)   Received from [facility]   Humiliation, Afraid, Rape, and Kick questionnaire    Within the last year, have you been afraid of your partner or ex-partner?: No    Within the last year, have you been humiliated or emotionally abused in other ways by your partner or ex-partner?: No    Within the last year, have you been kicked, hit, slapped, or otherwise physically hurt by your partner or ex-partner?: No    Within the last year, have you been raped or forced to have any kind of sexual activity by your partner or ex-partner?: No    Review of Systems: See HPI, otherwise negative ROS  Physical Exam: BP (!) 158/69   Pulse 98   Temp (!) 97.3 F (36.3 C) (Temporal)   Resp 20   Ht 5' 6.5 (1.689 m)   Wt 104.2 kg   SpO2 98%   BMI 36.52 kg/m  General:   Alert,  pleasant and cooperative in NAD Head:  Normocephalic and atraumatic. Lungs:  Clear to auscultation.    Heart:  Regular rate and rhythm.   Impression/Plan: Hannah Graves is here for ophthalmic surgery.  Risks, benefits, limitations, and alternatives regarding ophthalmic surgery have been reviewed with the patient.  Questions have been answered.  All parties agreeable.   Hannah GASKIN, MD  10/21/2023, 7:31 AM

## 2023-10-21 NOTE — Anesthesia Postprocedure Evaluation (Signed)
 Anesthesia Post Note  Patient: Hannah Graves  Procedure(s) Performed: PHACOEMULSIFICATION, CATARACT, WITH IOL INSERTION 6.18 00:37.8 (Right: Eye)  Patient location during evaluation: PACU Anesthesia Type: MAC Level of consciousness: awake and alert Pain management: pain level controlled Vital Signs Assessment: post-procedure vital signs reviewed and stable Respiratory status: spontaneous breathing, nonlabored ventilation, respiratory function stable and patient connected to nasal cannula oxygen Cardiovascular status: stable and blood pressure returned to baseline Postop Assessment: no apparent nausea or vomiting Anesthetic complications: no   No notable events documented.   Last Vitals:  Vitals:   10/21/23 0813 10/21/23 0818  BP:  (!) 113/50  Pulse: 83 83  Resp: 17 17  Temp: (!) 36.3 C (!) 36.3 C  SpO2: 96% 96%    Last Pain:  Vitals:   10/21/23 0818  TempSrc:   PainSc: 0-No pain                 Lendia LITTIE Mae

## 2023-10-21 NOTE — Op Note (Signed)
 LOCATION:  [facility]   PREOPERATIVE DIAGNOSIS:    Nuclear sclerotic cataract right eye. H25.11   POSTOPERATIVE DIAGNOSIS:  Nuclear sclerotic cataract right eye.     PROCEDURE:  Phacoemusification with posterior chamber intraocular lens placement of the right eye   ULTRASOUND TIME: Procedure(s): PHACOEMULSIFICATION, CATARACT, WITH IOL INSERTION 6.18 00:37.8 (Right)  LENS:   Implant Name Type Inv. Item Serial No. Manufacturer Lot No. LRB No. Used Action  LENS IOL TECNIS EYHANCE 21.0 - [id number] Intraocular Lens LENS IOL TECNIS EYHANCE 21.0 [id number] [employer]  Right 1 Implanted         SURGEON:  Dene FABIENE Etienne, MD   ANESTHESIA:  Topical with tetracaine drops and 2% Xylocaine jelly, augmented with 1% preservative-free intracameral lidocaine.    COMPLICATIONS:  None.   DESCRIPTION OF PROCEDURE:  The patient was identified in the holding room and transported to the operating room and placed in the supine position under the operating microscope.  The right eye was identified as the operative eye and it was prepped and draped in the usual sterile ophthalmic fashion.   A 1 millimeter clear-corneal paracentesis was made at the 12:00 position.  0.5 ml of preservative-free 1% lidocaine was injected into the anterior chamber. The anterior chamber was filled with Viscoat viscoelastic.  A 2.4 millimeter keratome was used to make a near-clear corneal incision at the 9:00 position.  A curvilinear capsulorrhexis was made with a cystotome and capsulorrhexis forceps.  Balanced salt solution was used to hydrodissect and hydrodelineate the nucleus.   Phacoemulsification was then used in stop and chop fashion to remove the lens nucleus and epinucleus.  The remaining cortex was then removed using the irrigation and aspiration handpiece. Provisc was then placed into the capsular bag to distend it for lens placement.  A lens was then injected into the capsular bag.  The remaining  viscoelastic was aspirated.   Wounds were hydrated with balanced salt solution.  The anterior chamber was inflated to a physiologic pressure with balanced salt solution.  No wound leaks were noted. Cefuroxime 0.1 ml of a 10mg /ml solution was injected into the anterior chamber for a dose of 1 mg of intracameral antibiotic at the completion of the case.   Brimonidine drops were applied to the eye.  The patient was taken to the recovery room in stable condition without complications of anesthesia or surgery.   Haila Dena 10/21/2023, 8:11 AM

## 2023-10-21 NOTE — Anesthesia Preprocedure Evaluation (Signed)
 Anesthesia Evaluation  Patient identified by MRN, date of birth, ID band Patient awake    Reviewed: Allergy & Precautions, NPO status , Patient's Chart, lab work & pertinent test results  Airway Mallampati: II  TM Distance: >3 FB Neck ROM: full    Dental   Pulmonary neg pulmonary ROS, former smoker   Pulmonary exam normal        Cardiovascular hypertension, On Medications negative cardio ROS Normal cardiovascular exam     Neuro/Psych negative neurological ROS  negative psych ROS   GI/Hepatic negative GI ROS, Neg liver ROS,,,  Endo/Other  negative endocrine ROSdiabetes, Well Controlled, Type 2, Oral Hypoglycemic Agents    Renal/GU      Musculoskeletal   Abdominal   Peds  Hematology  (+) Blood dyscrasia, anemia   Anesthesia Other Findings Past Medical History: No date: Diabetes mellitus without complication (HCC) No date: Diverticulitis No date: Diverticulosis No date: GI bleeding No date: Glaucoma No date: Hypertension No date: Multiple myeloma (HCC)  Past Surgical History: No date: ABDOMINAL HYSTERECTOMY No date: CHOLECYSTECTOMY No date: COLON SURGERY No date: THYROIDECTOMY  BMI    Body Mass Index: 36.52 kg/m      Reproductive/Obstetrics negative OB ROS                              Anesthesia Physical Anesthesia Plan  ASA: 2  Anesthesia Plan: MAC   Post-op Pain Management:    Induction: Intravenous  PONV Risk Score and Plan:   Airway Management Planned: Natural Airway and Nasal Cannula  Additional Equipment:   Intra-op Plan:   Post-operative Plan:   Informed Consent: I have reviewed the patients History and Physical, chart, labs and discussed the procedure including the risks, benefits and alternatives for the proposed anesthesia with the patient or authorized representative who has indicated his/her understanding and acceptance.     Dental Advisory  Given  Plan Discussed with: Anesthesiologist, CRNA and Surgeon  Anesthesia Plan Comments: (Patient consented for risks of anesthesia including but not limited to:  - adverse reactions to medications - damage to eyes, teeth, lips or other oral mucosa - nerve damage due to positioning  - sore throat or hoarseness - Damage to heart, brain, nerves, lungs, other parts of body or loss of life  Patient voiced understanding and assent.)         Anesthesia Quick Evaluation

## 2023-10-22 ENCOUNTER — Encounter: Payer: Self-pay | Admitting: Ophthalmology

## 2023-11-06 NOTE — Discharge Instructions (Signed)

## 2023-11-11 ENCOUNTER — Encounter
Admission: RE | Disposition: A | Discharge status: Home / Self Care | Payer: Self-pay | Source: Home / Self Care | Attending: Ophthalmology

## 2023-11-11 ENCOUNTER — Ambulatory Visit: Payer: Self-pay | Admitting: Anesthesiology

## 2023-11-11 ENCOUNTER — Other Ambulatory Visit: Payer: Self-pay

## 2023-11-11 ENCOUNTER — Ambulatory Visit
Admission: RE | Admit: 2023-11-11 | Discharge: 2023-11-11 | Disposition: A | Discharge status: Home / Self Care | Attending: Ophthalmology | Admitting: Ophthalmology

## 2023-11-11 ENCOUNTER — Encounter: Payer: Self-pay | Admitting: Anesthesiology

## 2023-11-11 ENCOUNTER — Encounter: Payer: Self-pay | Admitting: Ophthalmology

## 2023-11-11 DIAGNOSIS — Z7984 Long term (current) use of oral hypoglycemic drugs: Secondary | ICD-10-CM | POA: Insufficient documentation

## 2023-11-11 DIAGNOSIS — E1136 Type 2 diabetes mellitus with diabetic cataract: Secondary | ICD-10-CM | POA: Insufficient documentation

## 2023-11-11 DIAGNOSIS — I10 Essential (primary) hypertension: Secondary | ICD-10-CM | POA: Insufficient documentation

## 2023-11-11 DIAGNOSIS — H25012 Cortical age-related cataract, left eye: Secondary | ICD-10-CM | POA: Diagnosis present

## 2023-11-11 DIAGNOSIS — Z79899 Other long term (current) drug therapy: Secondary | ICD-10-CM | POA: Diagnosis not present

## 2023-11-11 DIAGNOSIS — H2512 Age-related nuclear cataract, left eye: Secondary | ICD-10-CM | POA: Insufficient documentation

## 2023-11-11 HISTORY — PX: CATARACT EXTRACTION W/PHACO: SHX586

## 2023-11-11 LAB — GLUCOSE, CAPILLARY: Glucose-Capillary: 130 mg/dL — ABNORMAL HIGH (ref 70–99)

## 2023-11-11 SURGERY — PHACOEMULSIFICATION, CATARACT, WITH IOL INSERTION
Anesthesia: General | Site: Eye | Laterality: Left

## 2023-11-11 MED ORDER — DROPERIDOL 2.5 MG/ML IJ SOLN: 0.6250 mg | Freq: Once | INTRAMUSCULAR | Status: DC | PRN

## 2023-11-11 MED ORDER — FENTANYL CITRATE (PF) 100 MCG/2ML IJ SOLN: 25.0000 ug | INTRAMUSCULAR | Status: DC | PRN

## 2023-11-11 MED ORDER — BRIMONIDINE TARTRATE-TIMOLOL 0.2-0.5 % OP SOLN
OPHTHALMIC | Status: DC | PRN
Start: 2023-11-11 — End: 2023-11-11
  Administered 2023-11-11: 1 [drp] via OPHTHALMIC

## 2023-11-11 MED ORDER — FENTANYL CITRATE (PF) 100 MCG/2ML IJ SOLN
INTRAMUSCULAR | Status: DC | PRN
  Administered 2023-11-11 (×2): 50 ug via INTRAVENOUS

## 2023-11-11 MED ORDER — FENTANYL CITRATE (PF) 100 MCG/2ML IJ SOLN
INTRAMUSCULAR | Status: AC
  Filled 2023-11-11: qty 2

## 2023-11-11 MED ORDER — OXYCODONE HCL 5 MG/5ML PO SOLN: 5.0000 mg | Freq: Once | ORAL | Status: DC | PRN

## 2023-11-11 MED ORDER — ARMC OPHTHALMIC DILATING DROPS
1.0000 | OPHTHALMIC | Status: DC | PRN
  Administered 2023-11-11 (×3): 1 via OPHTHALMIC

## 2023-11-11 MED ORDER — SIGHTPATH DOSE#1 NA HYALUR & NA CHOND-NA HYALUR IO KIT
PACK | INTRAOCULAR | Status: DC | PRN
  Administered 2023-11-11: 1 via OPHTHALMIC

## 2023-11-11 MED ORDER — CEFUROXIME OPHTHALMIC INJECTION 1 MG/0.1 ML
INJECTION | OPHTHALMIC | Status: DC | PRN
  Administered 2023-11-11: 1 mg via INTRACAMERAL

## 2023-11-11 MED ORDER — ACETAMINOPHEN 10 MG/ML IV SOLN: 1000.0000 mg | Freq: Once | INTRAVENOUS | Status: DC | PRN

## 2023-11-11 MED ORDER — MIDAZOLAM HCL 2 MG/2ML IJ SOLN
INTRAMUSCULAR | Status: AC
  Filled 2023-11-11: qty 2

## 2023-11-11 MED ORDER — MIDAZOLAM HCL 2 MG/2ML IJ SOLN
INTRAMUSCULAR | Status: DC | PRN
  Administered 2023-11-11 (×2): 1 mg via INTRAVENOUS

## 2023-11-11 MED ORDER — SIGHTPATH DOSE#1 BSS IO SOLN
INTRAOCULAR | Status: DC | PRN
  Administered 2023-11-11: 15 mL via INTRAOCULAR

## 2023-11-11 MED ORDER — LIDOCAINE HCL (PF) 2 % IJ SOLN
INTRAMUSCULAR | Status: DC | PRN
  Administered 2023-11-11: 2 mL

## 2023-11-11 MED ORDER — EPINEPHRINE PF 1 MG/ML IJ SOLN
INTRAMUSCULAR | Status: DC | PRN
  Administered 2023-11-11: 68 mL via OPHTHALMIC

## 2023-11-11 MED ORDER — LACTATED RINGERS IV SOLN: INTRAVENOUS | Status: DC

## 2023-11-11 MED ORDER — TETRACAINE HCL 0.5 % OP SOLN
1.0000 [drp] | OPHTHALMIC | Status: DC | PRN
  Administered 2023-11-11 (×3): 1 [drp] via OPHTHALMIC

## 2023-11-11 MED ORDER — OXYCODONE HCL 5 MG PO TABS: 5.0000 mg | ORAL_TABLET | Freq: Once | ORAL | Status: DC | PRN

## 2023-11-11 SURGICAL SUPPLY — 9 items
CATARACT SUITE SIGHTPATH (MISCELLANEOUS) ×1 IMPLANT
FEE CATARACT SUITE SIGHTPATH (MISCELLANEOUS) ×1 IMPLANT
GLOVE BIOGEL PI IND STRL 8 (GLOVE) ×1 IMPLANT
GLOVE SURG LX STRL 7.5 STRW (GLOVE) ×1 IMPLANT
GLOVE SURG SYN 6.5 PF PI BL (GLOVE) ×1 IMPLANT
LENS IOL TECNIS EYHANCE 21.5 (Intraocular Lens) IMPLANT
NDL FILTER BLUNT 18X1 1/2 (NEEDLE) ×1 IMPLANT
NEEDLE FILTER BLUNT 18X1 1/2 (NEEDLE) ×1 IMPLANT
SYR 3ML LL SCALE MARK (SYRINGE) ×1 IMPLANT

## 2023-11-11 NOTE — Anesthesia Postprocedure Evaluation (Signed)
 Anesthesia Post Note  Patient: ALETHEA TERHAAR  Procedure(s) Performed: PHACOEMULSIFICATION, CATARACT, WITH IOL INSERTION 10.06 00:40.3 (Left: Eye)  Patient location during evaluation: PACU Anesthesia Type: General Level of consciousness: awake and alert Pain management: pain level controlled Vital Signs Assessment: post-procedure vital signs reviewed and stable Respiratory status: spontaneous breathing, nonlabored ventilation, respiratory function stable and patient connected to nasal cannula oxygen Cardiovascular status: stable and blood pressure returned to baseline Postop Assessment: no apparent nausea or vomiting Anesthetic complications: no   No notable events documented.   Last Vitals:  Vitals:   11/11/23 0753 11/11/23 0758  BP: (!) 115/56 119/60  Pulse: 84 80  Resp: 13 (!) 23  Temp: 37.3 C 37.3 C  SpO2: 98% 96%    Last Pain:  Vitals:   11/11/23 0758  TempSrc:   PainSc: 0-No pain                 Lynwood KANDICE Clause

## 2023-11-11 NOTE — Transfer of Care (Signed)
 Immediate Anesthesia Transfer of Care Note  Patient: Hannah Graves  Procedure(s) Performed: PHACOEMULSIFICATION, CATARACT, WITH IOL INSERTION 10.06 00:40.3 (Left: Eye)  Patient Location: PACU  Anesthesia Type:MAC  Level of Consciousness: awake and alert   Airway & Oxygen Therapy: Patient Spontanous Breathing and Patient connected to nasal cannula oxygen  Post-op Assessment: Report given to RN and Post -op Vital signs reviewed and stable  Post vital signs: Reviewed and stable  Last Vitals:  Vitals Value Taken Time  BP    Temp    Pulse    Resp    SpO2      Last Pain:  Vitals:   11/11/23 0654  TempSrc: Tympanic  PainSc: 0-No pain         Complications: No notable events documented.

## 2023-11-11 NOTE — Anesthesia Preprocedure Evaluation (Signed)
 Anesthesia Evaluation  Patient identified by MRN, date of birth, ID band Patient awake    Reviewed: Allergy & Precautions, H&P , NPO status , Patient's Chart, lab work & pertinent test results, reviewed documented beta blocker date and time   Airway Mallampati: II  TM Distance: >3 FB Neck ROM: full    Dental no notable dental hx. (+) Teeth Intact   Pulmonary neg pulmonary ROS, former smoker   Pulmonary exam normal breath sounds clear to auscultation       Cardiovascular Exercise Tolerance: Good hypertension, On Medications negative cardio ROS Normal cardiovascular exam Rhythm:regular Rate:Normal     Neuro/Psych negative neurological ROS  negative psych ROS   GI/Hepatic negative GI ROS, Neg liver ROS,,,  Endo/Other  negative endocrine ROSdiabetes    Renal/GU negative Renal ROS  negative genitourinary   Musculoskeletal   Abdominal   Peds  Hematology  (+) Blood dyscrasia, anemia   Anesthesia Other Findings   Reproductive/Obstetrics negative OB ROS                              Anesthesia Physical Anesthesia Plan  ASA: 3  Anesthesia Plan: General LMA   Post-op Pain Management:    Induction:   PONV Risk Score and Plan:   Airway Management Planned:   Additional Equipment:   Intra-op Plan:   Post-operative Plan:   Informed Consent: I have reviewed the patients History and Physical, chart, labs and discussed the procedure including the risks, benefits and alternatives for the proposed anesthesia with the patient or authorized representative who has indicated his/her understanding and acceptance.       Plan Discussed with: CRNA  Anesthesia Plan Comments:         Anesthesia Quick Evaluation

## 2023-11-11 NOTE — H&P (Signed)
 [facility]   Primary Care Physician:  Pcp, No Ophthalmologist: Dr. Dene Etienne  Pre-Procedure History & Physical: HPI:  Hannah Graves is a 78 y.o. female here for ophthalmic surgery.   Past Medical History:  Diagnosis Date   Diabetes mellitus without complication (HCC)    Diverticulitis    Diverticulosis    GI bleeding    Glaucoma    Hypertension    Multiple myeloma [facility])     Past Surgical History:  Procedure Laterality Date   ABDOMINAL HYSTERECTOMY     CATARACT EXTRACTION W/PHACO Right 10/21/2023   Procedure: PHACOEMULSIFICATION, CATARACT, WITH IOL INSERTION 6.18 00:37.8;  Surgeon: Etienne Dene, MD;  Location: [facility];  Service: Ophthalmology;  Laterality: Right;   CHOLECYSTECTOMY     COLON SURGERY     THYROIDECTOMY      Prior to Admission medications   Medication Sig Start Date End Date Taking? Authorizing Provider  acetaminophen  (TYLENOL ) 650 MG CR tablet Take 650 mg by mouth every 8 (eight) hours as needed for pain.   Yes [provider]  albuterol  (ACCUNEB ) 1.25 MG/3ML nebulizer solution Take 1 ampule by nebulization every 6 (six) hours as needed for wheezing.   Yes [provider]  Biotin 2500 MCG CAPS Take 2,500 mcg by mouth daily.   Yes [provider]  Cholecalciferol (VITAMIN D3) 1000 units CAPS Take 1 capsule by mouth daily.   Yes [provider]  DILT-XR 180 MG 24 hr capsule  06/09/16  Yes [provider]  fluticasone  (FLONASE ) 50 MCG/ACT nasal spray Place 2 sprays into both nostrils daily.   Yes [provider]  furosemide (LASIX) 40 MG tablet Take 40 mg by mouth 2 (two) times daily.   Yes [provider]  gabapentin (NEURONTIN) 300 MG capsule Take 300 mg by mouth 3 (three) times daily.   Yes [provider]  glipiZIDE (GLUCOTROL XL) 10 MG 24 hr tablet Take 10 mg by mouth 2 (two) times daily.   Yes [provider]  losartan  (COZAAR ) 100 MG  tablet Take 100 mg by mouth daily.   Yes [provider]  montelukast (SINGULAIR) 10 MG tablet Take 10 mg by mouth at bedtime.   Yes [provider]  pravastatin (PRAVACHOL) 20 MG tablet Take 20 mg by mouth daily.   Yes [provider]  spironolactone (ALDACTONE) 50 MG tablet Take 50 mg by mouth daily.   Yes [provider]  tiotropium (SPIRIVA) 18 MCG inhalation capsule Place 18 mcg into inhaler and inhale daily.   Yes [provider]  vitamin C (ASCORBIC ACID) 500 MG tablet Take 500 mg by mouth daily.   Yes [provider]    Allergies as of 10/08/2023 - Review Complete 09/18/2016  Allergen Reaction Noted   Oxycodone  Rash 03/20/2015   Sulfa antibiotics Rash 03/20/2015    Family History  Problem Relation Age of Onset   Heart attack Mother    Hypertension Mother    Diabetes Mother    Heart attack Father    Hypertension Father    Diabetes Father     Social History   Socioeconomic History   Marital status: Single    Spouse name: Not on file   Number of children: Not on file   Years of education: Not on file   Highest education level: Not on file  Occupational History   Not on file  Tobacco Use   Smoking status: Former   Smokeless tobacco: Not on  file  Substance and Sexual Activity   Alcohol use: No   Drug use: Not on file   Sexual activity: Not on file  Other Topics Concern   Not on file  Social History Narrative   Not on file   Social Drivers of Health   Financial Resource Strain: Low Risk  (09/08/2022)   Received from [facility]   Overall Financial Resource Strain (CARDIA)    Difficulty of Paying Living Expenses: Not hard at all  Food Insecurity: No Food Insecurity (09/08/2022)   Received from [facility]   Hunger Vital Sign    Within the past 12 months, you worried that your food would run out before you got the money to buy more.: Never true    Within the past 12 months, the food you bought just  didn't last and you didn't have money to get more.: Never true  Transportation Needs: No Transportation Needs (09/08/2022)   Received from [facility]   PRAPARE - Transportation    Lack of Transportation (Medical): No    Lack of Transportation (Non-Medical): No  Physical Activity: Inactive (08/31/2020)   Received from [facility]   Exercise Vital Sign    On average, how many days per week do you engage in moderate to strenuous exercise (like a brisk walk)?: 0 days    On average, how many minutes do you engage in exercise at this level?: 0 min  Stress: Not on file  Social Connections: Not on file  Intimate Partner Violence: Not At Risk (09/08/2022)   Received from [facility]   Humiliation, Afraid, Rape, and Kick questionnaire    Within the last year, have you been afraid of your partner or ex-partner?: No    Within the last year, have you been humiliated or emotionally abused in other ways by your partner or ex-partner?: No    Within the last year, have you been kicked, hit, slapped, or otherwise physically hurt by your partner or ex-partner?: No    Within the last year, have you been raped or forced to have any kind of sexual activity by your partner or ex-partner?: No    Review of Systems: See HPI, otherwise negative ROS  Physical Exam: BP (!) 141/69   Temp (!) 97 F (36.1 C) (Tympanic)   Resp 17   Ht 5' 6.5 (1.689 m)   Wt 103.7 kg   SpO2 99%   BMI 36.35 kg/m  General:   Alert,  pleasant and cooperative in NAD Head:  Normocephalic and atraumatic. Lungs:  Clear to auscultation.    Heart:  Regular rate and rhythm.   Impression/Plan: Hannah Graves is here for ophthalmic surgery.  Risks, benefits, limitations, and alternatives regarding ophthalmic surgery have been reviewed with the patient.  Questions have been answered.  All parties agreeable.   Hannah GASKIN, MD  11/11/2023, 7:25 AM

## 2023-11-11 NOTE — Op Note (Signed)
 OPERATIVE NOTE  GRACEE RATTERREE [id number] 11/11/2023   PREOPERATIVE DIAGNOSIS:  Nuclear sclerotic cataract left eye. H25.12   POSTOPERATIVE DIAGNOSIS:    Nuclear sclerotic cataract left eye.     PROCEDURE:  Phacoemusification with posterior chamber intraocular lens placement of the left eye  Ultrasound time: Procedure(s): PHACOEMULSIFICATION, CATARACT, WITH IOL INSERTION 10.06 00:40.3 (Left)  LENS:   Implant Name Type Inv. Item Serial No. Manufacturer Lot No. LRB No. Used Action  LENS IOL TECNIS EYHANCE 21.5 - [id number] Intraocular Lens LENS IOL TECNIS EYHANCE 21.5 [id number] [employer]  Left 1 Implanted      SURGEON:  Dene FABIENE Etienne, MD   ANESTHESIA:  Topical with tetracaine  drops and 2% Xylocaine  jelly, augmented with 1% preservative-free intracameral lidocaine .    COMPLICATIONS:  None.   DESCRIPTION OF PROCEDURE:  The patient was identified in the holding room and transported to the operating room and placed in the supine position under the operating microscope.  The left eye was identified as the operative eye and it was prepped and draped in the usual sterile ophthalmic fashion.   A 1 millimeter clear-corneal paracentesis was made at the 1:30 position.  0.5 ml of preservative-free 1% lidocaine  was injected into the anterior chamber.  The anterior chamber was filled with Viscoat viscoelastic.  A 2.4 millimeter keratome was used to make a near-clear corneal incision at the 10:30 position.  .  A curvilinear capsulorrhexis was made with a cystotome and capsulorrhexis forceps.  Balanced salt solution was used to hydrodissect and hydrodelineate the nucleus.   Phacoemulsification was then used in stop and chop fashion to remove the lens nucleus and epinucleus.  The remaining cortex was then removed using the irrigation and aspiration handpiece. Provisc was then placed into the capsular bag to distend it for lens placement.  A lens was then injected into the capsular bag.  The  remaining viscoelastic was aspirated.   Wounds were hydrated with balanced salt solution.  The anterior chamber was inflated to a physiologic pressure with balanced salt solution.  No wound leaks were noted. Cefuroxime  0.1 ml of a 10mg /ml solution was injected into the anterior chamber for a dose of 1 mg of intracameral antibiotic at the completion of the case.   Timolol  and Brimonidine  drops were applied to the eye.  The patient was taken to the recovery room in stable condition without complications of anesthesia or surgery.  Brittony Billick 11/11/2023, 7:50 AM

## 2023-11-12 ENCOUNTER — Encounter: Payer: Self-pay | Admitting: Ophthalmology

## 2023-11-25 ENCOUNTER — Encounter: Payer: Self-pay | Admitting: Ophthalmology
